# Patient Record
Sex: Female | Born: 1985 | Marital: Married | Smoking: Never smoker
Health system: Southern US, Community
[De-identification: ages and names within clinical notes are randomized; demographics above are authoritative.]

## PROBLEM LIST (undated history)

## (undated) ENCOUNTER — Inpatient Hospital Stay (HOSPITAL_COMMUNITY): Payer: Self-pay

## (undated) DIAGNOSIS — D649 Anemia, unspecified: Secondary | ICD-10-CM

## (undated) DIAGNOSIS — F909 Attention-deficit hyperactivity disorder, unspecified type: Secondary | ICD-10-CM

## (undated) HISTORY — DX: Attention-deficit hyperactivity disorder, unspecified type: F90.9

## (undated) HISTORY — DX: Anemia, unspecified: D64.9

## (undated) HISTORY — PX: NO PAST SURGERIES: SHX2092

---

## 2008-05-01 ENCOUNTER — Ambulatory Visit: Payer: Self-pay | Admitting: Obstetrics & Gynecology

## 2008-05-01 ENCOUNTER — Encounter: Payer: Self-pay | Admitting: Family Medicine

## 2008-05-01 LAB — CONVERTED CEMR LAB
Antibody Screen: NEGATIVE
Eosinophils Relative: 1 % (ref 0–5)
HCT: 31.9 % — ABNORMAL LOW (ref 36.0–46.0)
Hemoglobin: 11.1 g/dL — ABNORMAL LOW (ref 12.0–15.0)
Hepatitis B Surface Ag: NEGATIVE
Lymphocytes Relative: 16 % (ref 12–46)
Lymphs Abs: 1.2 10*3/uL (ref 0.7–4.0)
Monocytes Absolute: 0.5 10*3/uL (ref 0.1–1.0)
RBC: 3.66 M/uL — ABNORMAL LOW (ref 3.87–5.11)
Rh Type: POSITIVE
Rubella: 52.5 intl units/mL — ABNORMAL HIGH
WBC: 7.3 10*3/uL (ref 4.0–10.5)

## 2008-05-15 ENCOUNTER — Other Ambulatory Visit: Admission: RE | Admit: 2008-05-15 | Discharge: 2008-05-15 | Payer: Self-pay | Admitting: Obstetrics & Gynecology

## 2008-05-15 ENCOUNTER — Encounter: Payer: Self-pay | Admitting: Obstetrics & Gynecology

## 2008-05-15 ENCOUNTER — Ambulatory Visit: Payer: Self-pay | Admitting: Obstetrics & Gynecology

## 2008-05-16 ENCOUNTER — Ambulatory Visit (HOSPITAL_COMMUNITY): Admission: RE | Admit: 2008-05-16 | Discharge: 2008-05-16 | Payer: Self-pay | Admitting: Obstetrics & Gynecology

## 2008-06-09 ENCOUNTER — Ambulatory Visit: Payer: Self-pay | Admitting: Family Medicine

## 2008-07-07 ENCOUNTER — Ambulatory Visit: Payer: Self-pay | Admitting: Obstetrics & Gynecology

## 2008-07-28 ENCOUNTER — Ambulatory Visit: Payer: Self-pay | Admitting: Obstetrics & Gynecology

## 2008-07-29 ENCOUNTER — Encounter: Payer: Self-pay | Admitting: Family Medicine

## 2008-07-29 LAB — CONVERTED CEMR LAB
Hemoglobin: 10.8 g/dL — ABNORMAL LOW (ref 12.0–15.0)
MCHC: 33 g/dL (ref 30.0–36.0)
MCV: 94.5 fL (ref 78.0–100.0)
RDW: 12.7 % (ref 11.5–15.5)

## 2008-07-31 ENCOUNTER — Ambulatory Visit (HOSPITAL_COMMUNITY): Admission: RE | Admit: 2008-07-31 | Discharge: 2008-07-31 | Payer: Self-pay | Admitting: Obstetrics & Gynecology

## 2008-08-25 ENCOUNTER — Ambulatory Visit: Payer: Self-pay | Admitting: Obstetrics & Gynecology

## 2008-10-06 ENCOUNTER — Ambulatory Visit: Payer: Self-pay | Admitting: Family Medicine

## 2008-10-07 ENCOUNTER — Encounter: Payer: Self-pay | Admitting: Family Medicine

## 2008-10-13 ENCOUNTER — Ambulatory Visit: Payer: Self-pay | Admitting: Obstetrics and Gynecology

## 2008-10-16 ENCOUNTER — Ambulatory Visit: Payer: Self-pay | Admitting: Obstetrics & Gynecology

## 2008-10-18 ENCOUNTER — Ambulatory Visit: Payer: Self-pay | Admitting: Advanced Practice Midwife

## 2008-10-18 ENCOUNTER — Inpatient Hospital Stay (HOSPITAL_COMMUNITY): Admission: AD | Admit: 2008-10-18 | Discharge: 2008-10-20 | Payer: Self-pay | Admitting: Obstetrics & Gynecology

## 2008-11-06 ENCOUNTER — Ambulatory Visit: Payer: Self-pay | Admitting: Obstetrics and Gynecology

## 2008-11-25 ENCOUNTER — Ambulatory Visit: Payer: Self-pay | Admitting: Obstetrics & Gynecology

## 2008-12-17 ENCOUNTER — Ambulatory Visit: Payer: Self-pay | Admitting: Family Medicine

## 2010-04-22 LAB — CBC
HCT: 36.8 % (ref 36.0–46.0)
MCHC: 34.6 g/dL (ref 30.0–36.0)
MCV: 95.9 fL (ref 78.0–100.0)
RBC: 3.84 MIL/uL — ABNORMAL LOW (ref 3.87–5.11)

## 2010-04-22 LAB — RPR: RPR Ser Ql: NONREACTIVE

## 2010-06-01 NOTE — Assessment & Plan Note (Signed)
Jasmine Ritter, Jasmine Ritter                ACCOUNT NO.:  [id number]   MEDICAL RECORD NO.:  [id number]          PATIENT TYPE:  POB   LOCATION:  [facility] at [facility]         FACILITY:  [facility]   PHYSICIAN:  Scheryl Darter, MD       DATE OF BIRTH:  03/12/1985   DATE OF SERVICE:  11/25/2008                                  CLINIC NOTE   The patient comes to the office today for postpartum exam.  The patient  delivered a baby girl on October 2.  Baby's name is Jasmine Ritter.  Baby is  doing very well.  Mom is doing very well.  She did not have any tears or  any difficulty with her delivery.  Since then, the baby has developed  rash which has given her some issues with her breast-feeding, but after  having her nystatin from the pediatrician, she is doing much better.  Baby is sleeping well mostly through the night.  The patient is doing  very well overall except for some feelings of sadness.  She does have a  longstanding history of anxiety and she is not sure that this is not  related to being new mother.  The patient will think about this for a  while, discuss with her partner the level of sadness, and the effects  that it is having on her life.  We discussed her weighing and balancing  the risk benefit ratios of taking medications such as Prozac while she  is breast-feeding.  The patient was given a Depo-Provera injection in  the hospital.  She has not started her menstrual cycle.  She would like  to continue on with the Depo for contraception.  She has not resumed  sexual activity with her partner.  They are at a place in their  relationship for they are uncertain whether they will resume intercourse  any time soon.   ALLERGIES:  No known drug allergies.   PHYSICAL EXAMINATION:  VITAL SIGNS:  Blood pressure is 126/72, weight is  129, height is 5 feet 3 inches, pulse 74.  GENERAL:  Well-developed, well-nourished, 25 year old Caucasian female  in no acute distress.  BREASTS:  There is a slight amount of  redness around the nipple.  Otherwise negative exam.  GENITALIA:  Externally, there are no lesions, discharges, tears  internally.  There is a very small amount of white discharge.  Cervix is  closed.  BIMANUAL:  No cervical motion tenderness.  Uterus is not palpable at  this time.  Adnexa, no mass appreciated.   ASSESSMENT:  Postpartum exam.   PLAN:  We had a lengthy discussion concerning the patient's anxiety  versus postpartum depression.  The patient will consider whether she  would like to start on a medication like Prozac if so she will call back  the office.  We will start her on the small dose of just 10-20 mg daily.  She will continue with her Depo-Provera 150 mg IM every 12-13  weeks.  The patient is aware that her next Depo needs to be around  Christmas time.  The patient will follow up for her yearly exam as  needed.  Jasmine Richter, NP    ______________________________  Scheryl Darter, MD    LR/MEDQ  D:  11/25/2008  T:  11/26/2008  Job:  [id number]

## 2013-01-17 DIAGNOSIS — Z333 Pregnant state, gestational carrier: Secondary | ICD-10-CM

## 2015-02-24 ENCOUNTER — Encounter (HOSPITAL_COMMUNITY): Payer: Self-pay | Admitting: General Practice

## 2015-02-24 ENCOUNTER — Inpatient Hospital Stay (HOSPITAL_COMMUNITY)
Admission: AD | Admit: 2015-02-24 | Discharge: 2015-02-27 | DRG: 885 | Disposition: A | Discharge status: Home / Self Care | Payer: MEDICAID | Source: Intra-hospital | Attending: Psychiatry | Admitting: Psychiatry

## 2015-02-24 ENCOUNTER — Encounter (HOSPITAL_COMMUNITY): Payer: Self-pay

## 2015-02-24 ENCOUNTER — Emergency Department (HOSPITAL_COMMUNITY)
Admission: EM | Admit: 2015-02-24 | Discharge: 2015-02-24 | Disposition: A | Discharge status: Other Acute Inpatient Hospital | Payer: Medicaid Other | Attending: Emergency Medicine | Admitting: Emergency Medicine

## 2015-02-24 DIAGNOSIS — Z915 Personal history of self-harm: Secondary | ICD-10-CM | POA: Diagnosis not present

## 2015-02-24 DIAGNOSIS — Z3202 Encounter for pregnancy test, result negative: Secondary | ICD-10-CM | POA: Insufficient documentation

## 2015-02-24 DIAGNOSIS — F322 Major depressive disorder, single episode, severe without psychotic features: Principal | ICD-10-CM | POA: Diagnosis present

## 2015-02-24 DIAGNOSIS — R45851 Suicidal ideations: Secondary | ICD-10-CM | POA: Diagnosis present

## 2015-02-24 DIAGNOSIS — R454 Irritability and anger: Secondary | ICD-10-CM | POA: Insufficient documentation

## 2015-02-24 DIAGNOSIS — F329 Major depressive disorder, single episode, unspecified: Secondary | ICD-10-CM | POA: Diagnosis present

## 2015-02-24 DIAGNOSIS — F32A Depression, unspecified: Secondary | ICD-10-CM | POA: Diagnosis present

## 2015-02-24 LAB — COMPREHENSIVE METABOLIC PANEL
ALBUMIN: 4.3 g/dL (ref 3.5–5.0)
ALT: 14 U/L (ref 14–54)
ANION GAP: 7 (ref 5–15)
AST: 13 U/L — ABNORMAL LOW (ref 15–41)
Alkaline Phosphatase: 59 U/L (ref 38–126)
BUN: 11 mg/dL (ref 6–20)
CHLORIDE: 106 mmol/L (ref 101–111)
CO2: 25 mmol/L (ref 22–32)
Calcium: 9.1 mg/dL (ref 8.9–10.3)
Creatinine, Ser: 0.66 mg/dL (ref 0.44–1.00)
GFR calc non Af Amer: >60 mL/min (ref >60)
GLUCOSE: 104 mg/dL — AB (ref 65–99)
POTASSIUM: 3.8 mmol/L (ref 3.5–5.1)
SODIUM: 138 mmol/L (ref 135–145)
Total Bilirubin: 0.9 mg/dL (ref 0.3–1.2)
Total Protein: 7.5 g/dL (ref 6.5–8.1)

## 2015-02-24 LAB — CBC
HEMATOCRIT: 35.5 % — AB (ref 36.0–46.0)
HEMOGLOBIN: 11.8 g/dL — AB (ref 12.0–15.0)
MCH: 29.1 pg (ref 26.0–34.0)
MCHC: 33.2 g/dL (ref 30.0–36.0)
MCV: 87.4 fL (ref 78.0–100.0)
Platelets: 217 10*3/uL (ref 150–400)
RBC: 4.06 MIL/uL (ref 3.87–5.11)
RDW: 12.4 % (ref 11.5–15.5)
WBC: 4.2 10*3/uL (ref 4.0–10.5)

## 2015-02-24 LAB — POC URINE PREG, ED: Preg Test, Ur: NEGATIVE

## 2015-02-24 LAB — RAPID URINE DRUG SCREEN, HOSP PERFORMED
Amphetamines: NOT DETECTED
BARBITURATES: NOT DETECTED
BENZODIAZEPINES: NOT DETECTED
COCAINE: NOT DETECTED
OPIATES: NOT DETECTED
TETRAHYDROCANNABINOL: NOT DETECTED

## 2015-02-24 LAB — ETHANOL: Alcohol, Ethyl (B): <5 mg/dL (ref <5)

## 2015-02-24 LAB — ACETAMINOPHEN LEVEL

## 2015-02-24 LAB — SALICYLATE LEVEL

## 2015-02-24 MED ORDER — ALUM & MAG HYDROXIDE-SIMETH 200-200-20 MG/5ML PO SUSP
30.0000 mL | ORAL | Status: DC | PRN
  Administered 2015-02-26: 30 mL via ORAL
  Filled 2015-02-24: qty 30

## 2015-02-24 MED ORDER — MAGNESIUM HYDROXIDE 400 MG/5ML PO SUSP: 30.0000 mL | Freq: Every day | ORAL | Status: DC | PRN

## 2015-02-24 MED ORDER — FLUOXETINE HCL 10 MG PO CAPS
10.0000 mg | ORAL_CAPSULE | Freq: Every day | ORAL | Status: DC
  Administered 2015-02-24: 10 mg via ORAL
  Filled 2015-02-24: qty 1

## 2015-02-24 MED ORDER — ACETAMINOPHEN 325 MG PO TABS: 650.0000 mg | ORAL_TABLET | Freq: Four times a day (QID) | ORAL | Status: DC | PRN

## 2015-02-24 MED ORDER — FLUOXETINE HCL 10 MG PO CAPS
10.0000 mg | ORAL_CAPSULE | Freq: Every day | ORAL | Status: DC
  Administered 2015-02-25: 10 mg via ORAL
  Filled 2015-02-24 (×3): qty 1

## 2015-02-24 MED ORDER — HYDROXYZINE HCL 25 MG PO TABS
25.0000 mg | ORAL_TABLET | Freq: Every day | ORAL | Status: DC
  Administered 2015-02-24 – 2015-02-25 (×2): 25 mg via ORAL
  Filled 2015-02-24 (×6): qty 1

## 2015-02-24 MED ORDER — HYDROXYZINE HCL 25 MG PO TABS: 25.0000 mg | ORAL_TABLET | Freq: Every day | ORAL | Status: DC

## 2015-02-24 NOTE — ED Notes (Signed)
Patient entered the unit ambulatory.  She is tearful on approach and has poor eye contact.  She admits that she tried to jump off of a building about two weeks ago, but could not go through with it.  She was offered food and drink, but declined.

## 2015-02-24 NOTE — Tx Team (Addendum)
Initial Interdisciplinary Treatment Plan   PATIENT STRESSORS: Financial difficulties Marital or family conflict Occupational concerns   PATIENT STRENGTHS: Average or above average intelligence Capable of independent living Communication skills Supportive family/friends   PROBLEM LIST: Problem List/Patient Goals Date to be addressed Date deferred Reason deferred Estimated date of resolution  Depression 02.07.2017     Suicidal Ideation 02.07.2017     Marital conflict 02.07.2017           "I need help stopping the thoughts of hurting myself"                               DISCHARGE CRITERIA:  Improved stabilization in mood, thinking, and/or behavior Motivation to continue treatment in a less acute level of care Need for constant or close observation no longer present Reduction of life-threatening or endangering symptoms to within safe limits Verbal commitment to aftercare and medication compliance  PRELIMINARY DISCHARGE PLAN: Attend aftercare/continuing care group Outpatient therapy Return to previous living arrangement  PATIENT/FAMIILY INVOLVEMENT: This treatment plan has been presented to and reviewed with the patient, Jasmine Ritter.  The patient and family have been given the opportunity to ask questions and make suggestions.  [place] Mon 02/24/2015, 5:33 PM

## 2015-02-24 NOTE — ED Notes (Signed)
Patient transferred to [facility].  She left the unit ambulatory with [employer].  She has no belongings here as her husband took them home.  She called her husband and left a message that she was moving so that he can bring her clothes there.

## 2015-02-24 NOTE — ED Notes (Signed)
Per spouse, pt is suicidal.  2 weeks ago pt took car. Was on phone with spouse and made statements of dropping off bridge.  Last night, pt making same statements of killing self after dropping dtr at school.  Pt denies SI/HI.  States she has hx of same.  However, does state nobody wants her to live and that her 28 year old dtr doesn't want her anymore.

## 2015-02-24 NOTE — BHH Counselor (Signed)
Patient accepted to [facility] by Dr. Jannifer Franklin and Julieanne Cotton, NP. Patient accepted to Room #302-2. Nursing report [phone]. Patient to be transported via [place].

## 2015-02-24 NOTE — Progress Notes (Signed)
pcp per [facility] access is [facility] [address] [place], [place] [address] [phone]

## 2015-02-24 NOTE — ED Notes (Signed)
Bed: WBH42 Expected date:  Expected time:  Means of arrival:  Comments: Triage 4 
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
## 2015-02-24 NOTE — ED Provider Notes (Signed)
CSN: [id number]     Arrival date & time 02/24/15  0757 History   First MD Initiated Contact with Patient 02/24/15 [phone]     Chief Complaint  Patient presents with  . Suicidal     (Consider location/radiation/quality/duration/timing/severity/associated sxs/prior Treatment) Patient is a 30 y.o. female presenting with mental health disorder. The history is provided by the patient.  Mental Health Problem Presenting symptoms: suicidal thoughts and suicidal threats   Patient accompanied by:  Spouse Degree of incapacity (severity):  Moderate Onset quality:  Gradual Duration:  24 months Timing:  Constant Progression:  Worsening Chronicity:  Recurrent Treatment compliance:  Untreated Relieved by:  Nothing Worsened by:  Nothing tried Ineffective treatments:  None tried Associated symptoms: no chest pain and no headaches   Risk factors: hx of suicide attempts (tried to slit her wrist in the distant past)   Risk factors: no hx of mental illness     30 yo F with a chief complaint of suicidal ideation. Patient states for the past couple years she has felt this way the getting worse over the past couple weeks. Patient has on a couple locations taken the car without signing any one where she was going and has told her husband that she was going to kill herself here recently. Told her that she was going to jump off a bridge. Has a history of trying to slit her wrists in the distant past. Does not see a psychiatrist does not take any medications for any medical problems. Unknown if any past psychiatric diagnoses. Patient feels like this is getting worse because her child is becoming very upset at school and when they tried make them do schoolwork of a lash out at her.  History reviewed. No pertinent past medical history. History reviewed. No pertinent past surgical history. History reviewed. No pertinent family history. Social History  Substance Use Topics  . Smoking status: Never Smoker   .  Smokeless tobacco: None  . Alcohol Use: Yes     Comment: social   OB History    No data available     Review of Systems  Constitutional: Negative for fever and chills.  HENT: Negative for congestion and rhinorrhea.   Eyes: Negative for redness and visual disturbance.  Respiratory: Negative for shortness of breath and wheezing.   Cardiovascular: Negative for chest pain and palpitations.  Gastrointestinal: Negative for nausea and vomiting.  Genitourinary: Negative for dysuria and urgency.  Musculoskeletal: Negative for myalgias and arthralgias.  Skin: Negative for pallor and wound.  Neurological: Negative for dizziness and headaches.  Psychiatric/Behavioral: Positive for suicidal ideas.      Allergies  Review of patient's allergies indicates no known allergies.  Home Medications   Prior to Admission medications   Not on File   BP 113/71 mmHg  Pulse 73  Temp(Src) 98.6 F (37 C) (Oral)  Resp 18  SpO2 100%  LMP 02/19/2015 Physical Exam  Constitutional: She is oriented to person, place, and time. She appears well-developed and well-nourished. No distress.  HENT:  Head: Normocephalic and atraumatic.  Eyes: EOM are normal. Pupils are equal, round, and reactive to light.  Neck: Normal range of motion. Neck supple.  Cardiovascular: Normal rate and regular rhythm.  Exam reveals no gallop and no friction rub.   No murmur heard. Pulmonary/Chest: Effort normal. She has no wheezes. She has no rales.  Abdominal: Soft. She exhibits no distension. There is no tenderness. There is no rebound.  Musculoskeletal: She exhibits no edema or tenderness.  Neurological: She is alert and oriented to person, place, and time.  Skin: Skin is warm and dry. She is not diaphoretic.  Psychiatric: Her behavior is normal. Her affect is angry. She expresses suicidal ideation. She expresses no homicidal ideation. She expresses no suicidal plans and no homicidal plans.  Nursing note and vitals  reviewed.   ED Course  Procedures (including critical care time) Labs Review Labs Reviewed  COMPREHENSIVE METABOLIC PANEL - Abnormal; Notable for the following:    Glucose, Bld 104 (*)    AST 13 (*)    All other components within normal limits  ACETAMINOPHEN LEVEL - Abnormal; Notable for the following:    Acetaminophen (Tylenol), Serum <10 (*)    All other components within normal limits  CBC - Abnormal; Notable for the following:    Hemoglobin 11.8 (*)    HCT 35.5 (*)    All other components within normal limits  ETHANOL  SALICYLATE LEVEL  URINE RAPID DRUG SCREEN, HOSP PERFORMED  POC URINE PREG, ED    Imaging Review No results found. I have personally reviewed and evaluated these images and lab results as part of my medical decision-making.   EKG Interpretation None      MDM   Final diagnoses:  Suicidal ideation    29yo F with a chief complaint of suicidal ideation. This is been an ongoing issue for this patient. Depressed on exam. TTS evaluation.  Psych will admit.   The patients results and plan were reviewed and discussed.   Any x-rays performed were independently reviewed by myself.   Differential diagnosis were considered with the presenting HPI.  Medications  FLUoxetine (PROZAC) capsule 10 mg (10 mg Oral Given 02/24/15 1126)  hydrOXYzine (ATARAX/VISTARIL) tablet 25 mg (not administered)    Filed Vitals:   02/24/15 0815 02/24/15 1258  BP: 133/90 113/71  Pulse: 74 73  Temp: 98 F (36.7 C) 98.6 F (37 C)  TempSrc: Oral Oral  Resp: 18 18  SpO2: 100% 100%    Final diagnoses:  Suicidal ideation    Admission/ observation were discussed with the admitting physician, patient and/or family and they are comfortable with the plan.    Melene Plan, DO 02/24/15 1438

## 2015-02-25 ENCOUNTER — Encounter (HOSPITAL_COMMUNITY): Payer: Self-pay | Admitting: Psychiatry

## 2015-02-25 MED ORDER — BUPROPION HCL ER (XL) 150 MG PO TB24
150.0000 mg | ORAL_TABLET | Freq: Every day | ORAL | Status: DC
Start: 2015-02-26 — End: 2015-02-27
  Administered 2015-02-26 – 2015-02-27 (×2): 150 mg via ORAL
  Filled 2015-02-25 (×4): qty 1

## 2015-02-25 MED ORDER — BUPROPION HCL ER (XL) 150 MG PO TB24: 150.0000 mg | ORAL_TABLET | Freq: Every day | ORAL | Status: DC

## 2015-02-25 NOTE — Progress Notes (Signed)
Adult Psychoeducational Group Note  Date:  02/25/2015 Time:  8:26 PM  Group Topic/Focus:  Wrap-Up Group:   The focus of this group is to help patients review their daily goal of treatment and discuss progress on daily workbooks.  Participation Level:  Active  Participation Quality:  Appropriate  Affect:  Appropriate  Cognitive:  Appropriate  Insight: Appropriate  Engagement in Group:  Engaged  Modes of Intervention:  Discussion  Additional Comments:  Pt was pleasant during wrap-up group. Pt rated her overall day a 6 out of 10 and stated that her day was "okay". Pt reported that she did not have a goal for the day, however the highlight of her day was seeing her husband.   Cleotilde Neer 02/25/2015, 8:52 PM
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
## 2015-02-25 NOTE — Plan of Care (Signed)
Problem: Diagnosis: Increased Risk For Suicide Attempt Goal: STG-Patient Will Comply With Medication Regime Outcome: Progressing Pt was compliant with scheduled medication tonight.      

## 2015-02-25 NOTE — [facility] Group Notes (Signed)
[facility] LCSW Group Therapy 02/25/2015 1:15 PM  Type of Therapy: Group Therapy- Emotion Regulation  Participation Level: Active   Participation Quality:  Appropriate  Affect: Appropriate  Cognitive: Alert and Oriented   Insight:  Developing/Improving  Engagement in Therapy: Developing/Improving and Engaged   Modes of Intervention: Clarification, Confrontation, Discussion, Education, Exploration, Limit-setting, Orientation, Problem-solving, Rapport Building, Dance movement psychotherapist, Socialization and Support  Summary of Progress/Problems: The topic for group today was emotional regulation. This group focused on both positive and negative emotion identification and allowed group members to process ways to identify feelings, regulate negative emotions, and find healthy ways to manage internal/external emotions. Group members were asked to reflect on a time when their reaction to an emotion led to a negative outcome and explored how alternative responses using emotion regulation would have benefited them. Group members were also asked to discuss a time when emotion regulation was utilized when a negative emotion was experienced. Pt expresses that she finds anger difficult to regulate, especially regarding the behavior of her 6yo daughter. Pt identified financial stress as a trigger for heightened anxiety and frustration. She participated appropriately in group discussion and activity.   Chad Cordial, LCSWA 02/25/2015 4:29 PM

## 2015-02-25 NOTE — B[facility] Suicide Risk Assessment (Signed)
[facility] INPATIENT:  Family/Significant Other Suicide Prevention Education  Suicide Prevention Education:  Education Completed; Hewitt Shorts, Pt's husband [phone],  has been identified by the patient as the family member/significant other with whom the patient will be residing, and identified as the person(s) who will aid the patient in the event of a mental health crisis (suicidal ideations/suicide attempt).  With written consent from the patient, the family member/significant other has been provided the following suicide prevention education, prior to the and/or following the discharge of the patient.  The suicide prevention education provided includes the following:  Suicide risk factors  Suicide prevention and interventions  National Suicide Hotline telephone number  [facility] assessment telephone number  [facility] Emergency Assistance 911  [facility] and/or Residential Mobile Crisis Unit telephone number  Request made of family/significant other to:  Remove weapons (e.g., guns, rifles, knives), all items previously/currently identified as safety concern.    Remove drugs/medications (over-the-counter, prescriptions, illicit drugs), all items previously/currently identified as a safety concern.  The family member/significant other verbalizes understanding of the suicide prevention education information provided.  The family member/significant other agrees to remove the items of safety concern listed above.  Elaina Hoops 02/25/2015, 4:28 PM

## 2015-02-25 NOTE — B[facility] Suicide Risk Assessment (Signed)
[facility] Admission Suicide Risk Assessment   Nursing information obtained from:    Demographic factors:    Current Mental Status:    Loss Factors:    Historical Factors:    Risk Reduction Factors:     Total Time spent with patient: 45 minutes Principal Problem: Severe major depression without psychotic features (HCC) Diagnosis:   Patient Active Problem List   Diagnosis Date Noted  . Severe major depression without psychotic features (HCC) [F32.2] 02/24/2015   Subjective Data: see admission H and P  Continued Clinical Symptoms:  Alcohol Use Disorder Identification Test Final Score (AUDIT): 1 The "Alcohol Use Disorders Identification Test", Guidelines for Use in Primary Care, Second Edition.  World Science writer [facility]). Score between 0-7:  no or low risk or alcohol related problems. Score between 8-15:  moderate risk of alcohol related problems. Score between 16-19:  high risk of alcohol related problems. Score 20 or above:  warrants further diagnostic evaluation for alcohol dependence and treatment.   CLINICAL FACTORS:   Depression:   Severe  Psychiatric Specialty Exam: ROS  Blood pressure 133/71, pulse 77, temperature 97.4 F (36.3 C), temperature source Oral, resp. rate 17, height  (1.626 m), weight 68.04 kg (150 lb), last menstrual period 02/19/2015, SpO2 100 %.Body mass index is 25.73 kg/(m^2).   COGNITIVE FEATURES THAT CONTRIBUTE TO RISK:  Closed-mindedness, Polarized thinking and Thought constriction (tunnel vision)    SUICIDE RISK:   Moderate:  Frequent suicidal ideation with limited intensity, and duration, some specificity in terms of plans, no associated intent, good self-control, limited dysphoria/symptomatology, some risk factors present, and identifiable protective factors, including available and accessible social support.  PLAN OF CARE: See admission H and P  I certify that inpatient services furnished can reasonably be expected to improve the patient's  condition.   Rachael Fee, MD 02/25/2015, 3:23 PM

## 2015-02-25 NOTE — B[facility] Group Notes (Signed)
[facility] LCSW Aftercare Discharge Planning Group Note   02/25/2015 9:34 AM  Participation Quality:  Appropriate   Mood/Affect:  Appropriate  Depression Rating:  "low"   Anxiety Rating:  "none"   Thoughts of Suicide:  No Will you contract for safety?   NA  Current AVH:  No  Plan for Discharge/Comments:  Pt reports that she does not want to be at the hospital and her husband thinks she is at risk for suicide. Pt reports issues with marriage and with 6yo daughter saying hurtful things to her. Pt denies any prior mental health treatment inpatient or outpatient. She denies Substance abuse.   Transportation Means: husband   Supports: husband/limited family supports   Counselling psychologist, Conservation officer, nature
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
## 2015-02-25 NOTE — Progress Notes (Signed)
Recreation Therapy Notes  Date: 02.08.2017  Time: 9:30am Location: [address] Group Room   Group Topic: Stress Management  Goal Area(s) Addresses:  Patient will actively participate in stress management techniques presented during session.   Behavioral Response: Did not attend.   Duff Pozzi L Azhar Knope, LRT/CTRS        Tyran Huser L 02/25/2015 2:40 PM 
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
## 2015-02-25 NOTE — Progress Notes (Signed)
Patient ID: Jasmine Ritter, female   DOB: [birth year], 30 y.o.   MRN: [id number]  DAR: Pt. Denies SI/HI and A/V Hallucinations however is able to contract for safety if she does have thoughts of SI. She reports sleep was fair, appetite is fair, energy level is low, and concentration is good. She rates depression 5-7/10, hopelessness 4/10, and anxiety 3/10. Patient does not report any pain or discomfort at this time. Support and encouragement provided to the patient. Scheduled medications administered to patient per physician's orders. Patient tolerated well and does not report any adverse effects to writer at this time. Patient is cooperative but remains minimal with Clinical research associate. She is seen in the milieu at times but stays to herself. Q15 minute checks are maintained for safety.

## 2015-02-25 NOTE — Progress Notes (Signed)
D: Pt has anxious affect and mood.  She reports "I don't wanna be here, but I know it's important."  Pt reports she had a good visit with her husband tonight.  Pt reports passive SI without a plan and she verbally contracts for safety.  She denies HI, denies hallucinations, denies pain.  Pt has been visible in milieu interacting with peers and staff appropriately. A: Introduced self to pt.  Met with pt 1:1 and offered support and encouragement.  Actively listened to pt.  Medication administered per order.   R: Pt is compliant with medication.  Pt verbally contracts for safety.  Will continue to monitor and assess.

## 2015-02-26 DIAGNOSIS — F322 Major depressive disorder, single episode, severe without psychotic features: Principal | ICD-10-CM

## 2015-02-26 MED ORDER — BUSPIRONE HCL 5 MG PO TABS
5.0000 mg | ORAL_TABLET | Freq: Three times a day (TID) | ORAL | Status: DC
  Administered 2015-02-26 – 2015-02-27 (×5): 5 mg via ORAL
  Filled 2015-02-26 (×10): qty 1

## 2015-02-26 NOTE — Progress Notes (Signed)
D: Patient continues to be superficial with her feelings.  She states she is just "ok."  She divulges little information about herself and her situation.  Her depression is "not really any."  Her goal today is to work on "anxiety.  Feel something so I can go home."  She rates her depression as a 1/2; hopelessness is a 0; anxiety is a 4-6.  She denies any physical pain.  She denies SI/HI/AVH.  She is attending groups and participating. A: Continue to monitor medication management and MD orders.  Safety checks completed every 15 minutes per protocol.  Offer support and encouragement as needed. R: Patient is receptive to staff; her behavior is appropriate.

## 2015-02-26 NOTE — [facility] Group Notes (Signed)
[facility] Group Notes:  (Nursing/MHT/Case Management/Adjunct)  Date:  02/26/2015  Time: 0830 am  Type of Therapy:  Psychoeducational Skills  Participation Level:  Did Not Attend Patient invited; declined to attend.  [place]ranford Mon 02/26/2015, 10:53 AM

## 2015-02-26 NOTE — Progress Notes (Signed)
Adult Psychoeducational Group Note  Date:  02/26/2015 Time:  8:57 PM  Group Topic/Focus:  Wrap-Up Group:   The focus of this group is to help patients review their daily goal of treatment and discuss progress on daily workbooks.  Participation Level:  Active  Participation Quality:  Appropriate  Affect:  Appropriate  Cognitive:  Alert  Insight: Appropriate  Engagement in Group:  Engaged  Modes of Intervention:  Problem-solving  Additional Comments:  Jasmine Ritter shared with the group that today was not so bad but it was better than the day before.  However she is expecting for it to a better day on tomorrow.   Jasmine Ritter 02/26/2015, 8:57 PM

## 2015-02-26 NOTE — Plan of Care (Signed)
Problem: Diagnosis: Increased Risk For Suicide Attempt Goal: LTG-Patient Will Report Improved Mood and Deny Suicidal LTG (by discharge) Patient will report improved mood and deny suicidal ideation.  Outcome: Progressing Patient denies SI.  She minimally expresses her feelings to staff.

## 2015-02-26 NOTE — Progress Notes (Signed)
Pt reports that her day has been ok, but that she would really like to go home.  Her husband came to visit her tonight.  She says she misses her daughter.  She denies SI/HI/AVH.  She was started on Wellbutrin today.  Discussed with her that maybe she should stay a couple of days to see how the medication affects her.  Pt still would like to go home tomorrow.  Pt has been observed in the dayroom, but with minimal interaction with peers.  Pt was encouraged to make her needs known to staff.  Support and encouragement offered.  Safety maintained with q15 minute checks.

## 2015-02-26 NOTE — [facility] Group Notes (Signed)
[facility] Mental [facility] [employer] Group Therapy 02/26/2015 1:15pm  Type of Therapy: Mental Health Association Presentation  Participation Level: Active  Participation Quality: Attentive  Affect: Appropriate  Cognitive: Oriented  Insight: Developing/Improving  Engagement in Therapy: Engaged  Modes of Intervention: Discussion, Education and Socialization  Summary of Progress/Problems: Mental Health Association (MHA) Speaker came to talk about his personal journey with substance abuse and addiction. The pt processed ways by which to relate to the speaker. MHA speaker provided handouts and educational information pertaining to groups and services offered by the [facility]. Pt was engaged in speaker's presentation and was receptive to resources provided.    Chad Cordial, LCSWA 02/26/2015 1:49 PM
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
## 2015-02-27 MED ORDER — BUSPIRONE HCL 5 MG PO TABS: 5.0000 mg | ORAL_TABLET | Freq: Three times a day (TID) | ORAL | Status: DC

## 2015-02-27 MED ORDER — HYDROXYZINE HCL 25 MG PO TABS: 25.0000 mg | ORAL_TABLET | Freq: Every day | ORAL | Status: DC

## 2015-02-27 MED ORDER — BUPROPION HCL ER (XL) 150 MG PO TB24: 150.0000 mg | ORAL_TABLET | Freq: Every day | ORAL | Status: DC

## 2015-02-27 NOTE — Progress Notes (Signed)
[facility] Discharge note:  D:  Pt. verbalizes readiness for discharge and denies SI/HI.   A: Discharge instructions reviewed with patient and family, belongings returned, prescriptions given as applicable.    R: Pt. And family verbalize understanding of d/c instructions and state their intent to be compliant with them.  Pt discharged to caregiver without incident.  Sultana Tierney, RN  

## 2015-02-27 NOTE — Discharge Summary (Signed)
Physician Discharge Summary Note  Patient:  Jasmine Ritter is an 30 y.o., female MRN:  [id number] DOB:  Jun 12, 1985  Patient phone:  [phone] (home)   Patient address:   [address] [place] [address],   Total Time spent with patient: Greater than 30 minutes  Date of Admission:  02/24/2015  Date of Discharge: 02-27-15  Reason for Admission: Worsening symptoms of depression triggering suicidal ideations  Principal Problem: Severe major depression without psychotic features [facility])  Discharge Diagnoses: Patient Active Problem List   Diagnosis Date Noted  . Severe major depression without psychotic features [facility]) [F32.2] 02/24/2015   Past Psychiatric History: Major depressive disorder, Self-mutilation  Past Medical History: History reviewed. No pertinent past medical history. History reviewed. No pertinent past surgical history.  Family History: History reviewed. No pertinent family history.  Family Psychiatric  History: See H&P  Social History:  History  Alcohol Use  . Yes    Comment: social     History  Drug Use No    Social History   Social History  . Marital Status: Single    Spouse Name: N/A  . Number of Children: N/A  . Years of Education: N/A   Social History Main Topics  . Smoking status: Never Smoker   . Smokeless tobacco: None  . Alcohol Use: Yes     Comment: social  . Drug Use: No  . Sexual Activity: Not Asked   Other Topics Concern  . None   Social History Narrative   Hospital Course: 30 Y/O female who states her husband brought her as he thinks she wants to kill herself. Two weeks ago she went to the parking deck downtown [place] to kill herself. States she was there by herself and could not get to do it. States things started to pile up again and daughter was telling her she did not want to have a mother. States she feels her husband is not supportive. States last few years she feels he does not care. 7-8 years ago tried to  kill herself by cutting. At that time she was dealing with family of origin issues. She describes traumatic experiences when growing up and for a time there she had symptoms of PTSD but not anymore. She does not feel she had postpartum depression as they were busy active after their daughter was born. She does say there is a seasonal component to the depression. Her husband is in the mental health field but now is working at a [employer]. She states he himself seems to be dealing with his own stuff. She was diagnosed with ADHD early on and had been on stimulants but not any more.  Safire was admitted to the hospital for worsening symptoms of depression/crisis management. She presented with chronic depression & hx of suicide attempt by cutting. After her admission assessment, her presenting symptoms were identified. The medication regimen targeting those symptoms were initiated with her consent. She was medicated & discharged on; Wellbutrin XL 150 mg for depression, Buspar 5 mg for anxiety & Hydroxyzine 25 mg for anxiety. She presented no other significant  pre-existing medical problems that required treatment. She tolerated her treatment regimen without any adverse effects reported. Mikeyla was enrolled & participated in the group counseling sessions being offered & held on this unit. She learned coping skills..  During the course of her hospitalization, Mahima's mprovement was monitored by observation & her daily report of symptom reduction. Her emotional and mental status were monitored by daily  self-inventory reports completed by her & the clinical staff. She was evaluated by the treatment team for stability and plans for continued recovery after discharge. Makyla's motivation was an integral factor in her mood stability. She was offered further treatment options upon discharge as noted. Upon discharge, Alezandra was both mentally & medically stable. She denies suicidal/homicidal ideations, auditory/visual/tactile  hallucinations, delusional thoughts or paranoia. She [facility] with [facility] belongings in no distress. Transportation per husband.   Physical Findings: AIMS: Facial and Oral Movements Muscles of Facial Expression: None, normal Lips and Perioral Area: None, normal Jaw: None, normal Tongue: None, normal,Extremity Movements Upper (arms, wrists, hands, fingers): None, normal Lower (legs, knees, ankles, toes): None, normal, Trunk Movements Neck, shoulders, hips: None, normal, Overall Severity Severity of abnormal movements (highest score from questions above): None, normal Incapacitation due to abnormal movements: None, normal Patient's awareness of abnormal movements (rate only patient's report): No Awareness, Dental Status Current problems with teeth and/or dentures?: No Does patient usually wear dentures?: No  CIWA:    COWS:     Musculoskeletal: Strength & Muscle Tone: within normal limits Gait & Station: normal Patient leans: N/A  Psychiatric Specialty Exam: Review of Systems  Constitutional: Negative.   HENT: Negative.   Eyes: Negative.   Respiratory: Negative.   Cardiovascular: Negative.   Gastrointestinal: Negative.   Genitourinary: Negative.   Musculoskeletal: Negative.   Skin: Negative.   Neurological: Negative.   Endo/Heme/Allergies: Negative.   Psychiatric/Behavioral: Positive for depression (Stable). Negative for suicidal ideas, hallucinations, memory loss and substance abuse. The patient has insomnia (Stable). The patient is not nervous/anxious.     Blood pressure 107/70, pulse 91, temperature 97.9 F (36.6 C), temperature source Oral, resp. rate 16, height  (1.626 m), weight 68.04 kg (150 lb), last menstrual period 02/19/2015, SpO2 100 %.Body mass index is 25.73 kg/(m^2).  See Md's SRA   Have you used any form of tobacco in the last 30 days? (Cigarettes, Smokeless Tobacco, Cigars, and/or Pipes): No  Has this patient used any form of tobacco in the last 30 days?  (Cigarettes, Smokeless Tobacco, Cigars, and/or Pipes):  No  Metabolic Disorder Labs:  No results found for: HGBA1C, MPG No results found for: PROLACTIN No results found for: CHOL, TRIG, HDL, CHOLHDL, VLDL, LDLCALC  See Psychiatric Specialty Exam and Suicide Risk Assessment completed by Attending Physician prior to discharge.  Discharge destination:  Home  Is patient on multiple antipsychotic therapies at discharge:  No   Has Patient had three or more failed trials of antipsychotic monotherapy by history:  No  Recommended Plan for Multiple Antipsychotic Therapies: NA    Medication List    TAKE these medications      Indication   buPROPion 150 MG 24 hr tablet  Commonly known as:  WELLBUTRIN XL  Take 1 tablet (150 mg total) by mouth daily. For depression   Indication:  Major Depressive Disorder     busPIRone 5 MG tablet  Commonly known as:  BUSPAR  Take 1 tablet (5 mg total) by mouth 3 (three) times daily. For anxiety   Indication:  Generalized Anxiety Disorder     hydrOXYzine 25 MG tablet  Commonly known as:  ATARAX/VISTARIL  Take 1 tablet (25 mg total) by mouth at bedtime. For anxiety/insomnia   Indication:  Anxiety, insomnia       Follow-up Information    Follow up with [facility] On 03/10/2015.   Why:  at 5:00pm for your initial new patient assessment. Please  arrive 30 minutes early to complete paperwork. Also, bring ID, insurance information, and list of medications.   Contact information:   [address] [place], [place] [address] Phone: [phone] Fax: [phone]      Follow up with [facility] On 03/16/2015.   Why:  at 2:20pm for your psych evaluation and medication management.   Contact information:   [address] [place], [place] [address] Phone: [phone] Fax: [phone]     Follow-up recommendations: Activity:  As tolerated Diet: As recommended by your primary care doctor. Keep all scheduled follow-up appointments as  recommended.   Comments: Take all your medications as prescribed by your mental healthcare provider. Report any adverse effects and or reactions from your medicines to your outpatient provider promptly. Patient is instructed and cautioned to not engage in alcohol and or illegal drug use while on prescription medicines. In the event of worsening symptoms, patient is instructed to call the crisis hotline, 911 and or go to the nearest ED for appropriate evaluation and treatment of symptoms. Follow-up with your primary care provider for your other medical issues, concerns and or health care needs.   Signed: Sanjuana Kava, NP, PMHNP, FNP-BC 02/27/2015, 11:08 AM  Patient seen, Suicide Assessment Completed.  Disposition Plan Reviewed
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
## 2015-02-27 NOTE — Progress Notes (Signed)
  [facility] Adult Case Management Discharge Plan :  Will you be returning to the same living situation after discharge:  Yes,  Pt returning home At discharge, do you have transportation home?: Yes,  Pt husband to pick up Do you have the ability to pay for your medications: Yes,  Pt provided with prescriptions  Release of information consent forms completed and in the chart;  Patient's signature needed at discharge.  Patient to Follow up at: Follow-up Information    Follow up with [facility] On 03/10/2015.   Why:  at 5:00pm for your initial new patient assessment. Please arrive 30 minutes early to complete paperwork. Also, bring ID, insurance information, and list of medications.   Contact information:   [address] [place], [place] [address] Phone: [phone] Fax: [phone]      Follow up with [facility] On 03/16/2015.   Why:  at 2:20pm for your psych evaluation and medication management.   Contact information:   [address] [place], [place] [address] Phone: [phone] Fax: [phone]      Next level of care provider has access to [facility] Link:no  Safety Planning and Suicide Prevention discussed: Yes,  with husband; see SPE note for further details  Have you used any form of tobacco in the last 30 days? (Cigarettes, Smokeless Tobacco, Cigars, and/or Pipes): No  Has patient been referred to the Quitline?: N/A patient is not a smoker  Patient has been referred for addiction treatment: N/A  Elaina Hoops 02/27/2015, 11:41 AM

## 2015-02-27 NOTE — Progress Notes (Signed)
Pt has been observed spending a great deal of time on the phone this evening.  She reports her day has been good.  She says she was started on a new med today.  She is hopeful that the combo of medication with help her be able to function at home and deal with her daughter.  She denies SI/HI/AVH.  She makes her needs known to staff.  Support and encouragement offered.  Discharge plans are in process.  Safety maintained with q15 minute checks.

## 2015-02-27 NOTE — BHH Group Notes (Signed)
[facility] LCSW Aftercare Discharge Planning Group Note  02/27/2015 8:45 AM  Participation Quality: Alert, Appropriate and Oriented  Mood/Affect: Appropriate  Depression Rating: 1  Anxiety Rating: 1  Thoughts of Suicide: Pt denies SI/HI  Will you contract for safety? Yes  Current AVH: Pt denies  Plan for Discharge/Comments: Pt attended discharge planning group and actively participated in group. CSW discussed suicide prevention education with the group and encouraged them to discuss discharge planning and any relevant barriers. Pt reports that her anxiety is related to a desire to return home. She expresses feeling better today.  Transportation Means: Pt reports access to transportation  Supports: No supports mentioned at this time  Chad Cordial, LCSWA 02/27/2015 9:32 AM

## 2015-02-27 NOTE — BHH Suicide Risk Assessment (Signed)
[facility] Discharge Suicide Risk Assessment   Principal Problem: Severe major depression without psychotic features [facility]) Discharge Diagnoses:  Patient Active Problem List   Diagnosis Date Noted  . Severe major depression without psychotic features (HCC) [F32.2] 02/24/2015    Total Time spent with patient: 30 minutes  Musculoskeletal: Strength & Muscle Tone: within normal limits Gait & Station: normal Patient leans: N/A  Psychiatric Specialty Exam: ROS  Blood pressure 107/70, pulse 91, temperature 97.9 F (36.6 C), temperature source Oral, resp. rate 16, height  (1.626 m), weight 150 lb (68.04 kg), last menstrual period 02/19/2015, SpO2 100 %.Body mass index is 25.73 kg/(m^2).  General Appearance: Well Groomed  Eye Contact::  Good  Speech:  Normal Rate409  Volume:  Normal  Mood:  improved mood, at this time feeling better, not depressed   Affect:  Appropriate and Full Range  Thought Process:  Linear  Orientation:  Full (Time, Place, and Person)  Thought Content:  denies hallucinations, no delusions,  not internally preoccupied   Suicidal Thoughts:  No- denies any suicidal ideations, no self injurious ideations, no homicidal ideations,   Homicidal Thoughts:  No  Memory:  recent and remote grossly intact   Judgement:  Improved   Insight:   Improving   Psychomotor Activity:  Normal  Concentration:  Good  Recall:  Good  Fund of Knowledge:Good  Language: Good  Akathisia:  Negative  Handed:  Right  AIMS (if indicated):     Assets:  Communication Skills Desire for Improvement Resilience  Sleep:  Number of Hours: 6  Cognition: WNL  ADL's:  Intact   Mental Status Per Nursing Assessment::   On Admission:     Demographic Factors:  30 year old married female, one daughter   Loss Factors: Family stressors   Historical Factors: History of depression  Risk Reduction Factors:   Responsible for children under 23 years of age, Sense of responsibility to family, Living with  another person, especially a relative and Positive social support  Continued Clinical Symptoms:  Today patient reports feeling better and denying any significant depression. Reports improved mood and presents with fuller range of affect, no thought disorder, no suicidal ideations or any self injurious ideations, no homicidal ideations, no hallucinations, no delusions . Future oriented. States she has had good/ positive conversations with her husband and that she feels more supported, and reports she is looking forward to reunite with her daughter, who has been asking her to come back home because she is missed. She states she no longer feels as if she is not needed or appreciated, and is wanting to return home. Denies medication side effects.  Cognitive Features That Contribute To Risk:  No gross cognitive deficits noted upon discharge. Is alert , attentive, and oriented x 3    Suicide Risk:  Mild:  Suicidal ideation of limited frequency, intensity, duration, and specificity.  There are no identifiable plans, no associated intent, mild dysphoria and related symptoms, good self-control (both objective and subjective assessment), few other risk factors, and identifiable protective factors, including available and accessible social support.  Follow-up Information    Follow up with [facility] On 03/10/2015.   Why:  at 5:00pm for your initial new patient assessment. Please arrive 30 minutes early to complete paperwork. Also, bring ID, insurance information, and list of medications.   Contact information:   [address] [place], [place] [address] Phone: [phone] Fax: [phone]      Follow up with [facility] On 03/16/2015.   Why:  at 2:20pm for your psych evaluation and medication management.   Contact information:   [address] [place], [place] [address] Phone: [phone] Fax: [phone]      Plan Of Care/Follow-up recommendations:  Activity:  as  tolerated  Diet:  Regular Tests:  NA Other:  see below  Patient is requesting discharge and there are no current grounds for involuntary commitment  She is leaving unit in good spirits  Plans to return home - states husband will pick her up later today Plans to follow up as above, and states her  And husband are probably going to go for couples therapy as well .  Nehemiah Massed, MD 02/27/2015, 2:29 PM

## 2017-01-17 NOTE — L&D Delivery Note (Addendum)
Patient: JADEE GOLEBIEWSKI MRN: [id number]  GBS status: negative, IAP given: no  Patient is a 32 y.o. now [id number] s/p NSVD at [redacted]w[redacted]d, who was admitted for IOL for postdates. AROM 1h 30m prior to delivery with clear fluid.    Delivery Note At 7:42 PM a viable female was delivered via SVD (Presentation: cephalic; LOA).  Shoulder dystocia ~1 minute with McRoberts and suprapubic pressure. APGAR: 7, 9; weight 8 lb 14.2 oz (4031 g).   Placenta status: intact, 3-vessel cord to L&D.  Cord:  with the following complications: none.  Cord pH: none  Anesthesia:   Episiotomy:   Lacerations: 2nd degree;Perineal Suture Repair: 3.0 Est. Blood Loss (mL): ~400   Mom to postpartum.  Baby to Couplet care / Skin to Skin.  Gwenevere Abbot 11/04/2017, 8:49 PM     Head delivered LOA. No nuchal cord present. After delivery of the head, anterior shoulder was lodged under pelvic bone.  Called for McRoberts, which was performed by 2 nurses and suprapubic pressure with rocking to maternal right.  After approximately 45 seconds, Dr. Jolayne Panther was called into the room for assistance.  The anterior shoulder was then delivered and the body followed. Infant without spontaneous cry, cord clamped x 2 immediately by delivery provider and baby taken to warmer for assessment. Cord blood drawn. Placenta delivered spontaneously with gentle cord traction. Fundus firm with massage and Pitocin. Perineum inspected and found to have 2nd laceration, which was repaired with 3.0 vicryl with good hemostasis achieved.   Please schedule this patient for Postpartum visit in: 4 weeks with the following provider: Any provider  For C/S patients schedule nurse incision check in weeks 2 weeks: no  Low risk pregnancy complicated by: none  Delivery mode: SVD  Anticipated Birth Control: Nexplanon  PP Procedures needed: none  Schedule Integrated BH visit: no

## 2017-03-21 ENCOUNTER — Ambulatory Visit (INDEPENDENT_AMBULATORY_CARE_PROVIDER_SITE_OTHER): Payer: Medicaid Other | Admitting: General Practice

## 2017-03-21 ENCOUNTER — Ambulatory Visit: Payer: Self-pay

## 2017-03-21 ENCOUNTER — Encounter: Payer: Self-pay | Admitting: General Practice

## 2017-03-21 DIAGNOSIS — Z3201 Encounter for pregnancy test, result positive: Secondary | ICD-10-CM

## 2017-03-21 DIAGNOSIS — Z3491 Encounter for supervision of normal pregnancy, unspecified, first trimester: Secondary | ICD-10-CM

## 2017-03-21 LAB — POCT PREGNANCY, URINE: Preg Test, Ur: POSITIVE — AB

## 2017-03-21 NOTE — Progress Notes (Signed)
Patient here for upt today. UPT +. Patient reports first positive home test a week ago. LMP approximately 01/24/17. Patient reports light period 03/04/17 which was off and on spotting over 6 days. EDD 10/31/17 8 weeks. Patient denies taking any meds. Recommended she begin PNV. Scheduled patient for ultrasound 3/12 due to irregular bleeding. Patient verbalized understanding to appt & had no questions

## 2017-03-22 NOTE — Progress Notes (Signed)
I have reviewed this chart and agree with the RN/CMA assessment and management.    K. Meryl Betti Goodenow, M.D. Attending Obstetrician & Gynecologist, [facility], [facility]  

## 2017-03-28 ENCOUNTER — Ambulatory Visit: Payer: Medicaid Other | Admitting: General Practice

## 2017-03-28 ENCOUNTER — Ambulatory Visit (HOSPITAL_COMMUNITY)
Admission: RE | Admit: 2017-03-28 | Discharge: 2017-03-28 | Disposition: A | Discharge status: Home / Self Care | Payer: Medicaid Other | Source: Ambulatory Visit | Attending: Obstetrics and Gynecology | Admitting: Obstetrics and Gynecology

## 2017-03-28 DIAGNOSIS — Z3491 Encounter for supervision of normal pregnancy, unspecified, first trimester: Secondary | ICD-10-CM | POA: Diagnosis present

## 2017-03-28 DIAGNOSIS — Z3A08 8 weeks gestation of pregnancy: Secondary | ICD-10-CM | POA: Diagnosis not present

## 2017-03-28 DIAGNOSIS — Z712 Person consulting for explanation of examination or test findings: Secondary | ICD-10-CM

## 2017-03-28 NOTE — Progress Notes (Signed)
Chart reviewed for nurse visit. Agree with plan of care.   Pincus Largehelps, Mallori Araque Y, DO 03/28/2017 3:12 PM

## 2017-03-28 NOTE — Progress Notes (Signed)
Patient here for viability results today. Reviewed ultrasound with Dr Doroteo GlassmanPhelps who finds living IUP, patient should begin prenatal care. Informed patient of results, reviewed dating, & provided pictures. Recommended she begin OB care. Patient verbalized understanding to all & had no questions

## 2017-05-02 ENCOUNTER — Other Ambulatory Visit (HOSPITAL_COMMUNITY)
Admission: RE | Admit: 2017-05-02 | Discharge: 2017-05-02 | Disposition: A | Discharge status: Home / Self Care | Payer: Medicaid Other | Source: Ambulatory Visit | Attending: Family Medicine | Admitting: Family Medicine

## 2017-05-02 ENCOUNTER — Ambulatory Visit (INDEPENDENT_AMBULATORY_CARE_PROVIDER_SITE_OTHER): Payer: Medicaid Other | Admitting: Family Medicine

## 2017-05-02 ENCOUNTER — Encounter: Payer: Self-pay | Admitting: Family Medicine

## 2017-05-02 VITALS — BP 110/63 | HR 75 | Wt 153.0 lb

## 2017-05-02 DIAGNOSIS — O210 Mild hyperemesis gravidarum: Secondary | ICD-10-CM

## 2017-05-02 DIAGNOSIS — Z3482 Encounter for supervision of other normal pregnancy, second trimester: Secondary | ICD-10-CM | POA: Insufficient documentation

## 2017-05-02 DIAGNOSIS — Z3491 Encounter for supervision of normal pregnancy, unspecified, first trimester: Secondary | ICD-10-CM

## 2017-05-02 DIAGNOSIS — Z3490 Encounter for supervision of normal pregnancy, unspecified, unspecified trimester: Secondary | ICD-10-CM | POA: Insufficient documentation

## 2017-05-02 LAB — POCT URINALYSIS DIP (DEVICE)
Glucose, UA: NEGATIVE mg/dL
HGB URINE DIPSTICK: NEGATIVE
Nitrite: NEGATIVE
Protein, ur: 30 mg/dL — AB
SPECIFIC GRAVITY, URINE: 1.025 (ref 1.005–1.030)
Urobilinogen, UA: 1 mg/dL (ref 0.0–1.0)
pH: 6 (ref 5.0–8.0)

## 2017-05-02 NOTE — Patient Instructions (Addendum)
For nausea/vomiting in pregnancy get this over the counter: - Vitamin B6 3 x day  If persistent symptoms, add : - Doxylamine (Unisom) at bedtime (this will be in the sleep aid section of the pharmacy)   Morning Sickness Morning sickness is when you feel sick to your stomach (nauseous) during pregnancy. This nauseous feeling may or may not come with vomiting. It often occurs in the morning but can be a problem any time of day. Morning sickness is most common during the first trimester, but it may continue throughout pregnancy. While morning sickness is unpleasant, it is usually harmless unless you develop severe and continual vomiting (hyperemesis gravidarum). This condition requires more intense treatment. What are the causes? The cause of morning sickness is not completely known but seems to be related to normal hormonal changes that occur in pregnancy. What increases the risk? You are at greater risk if you:  Experienced nausea or vomiting before your pregnancy.  Had morning sickness during a previous pregnancy.  Are pregnant with more than one baby, such as twins.  How is this treated? Do not use any medicines (prescription, over-the-counter, or herbal) for morning sickness without first talking to your health care provider. Your health care provider may prescribe or recommend:  Vitamin B6 supplements.  Anti-nausea medicines.  The herbal medicine ginger.  Follow these instructions at home:  Only take over-the-counter or prescription medicines as directed by your health care provider.  Taking multivitamins before getting pregnant can prevent or decrease the severity of morning sickness in most women.  Eat a piece of dry toast or unsalted crackers before getting out of bed in the morning.  Eat five or six small meals a day.  Eat dry and bland foods (rice, baked potato). Foods high in carbohydrates are often helpful.  Do not drink liquids with your meals. Drink liquids  between meals.  Avoid greasy, fatty, and spicy foods.  Get someone to cook for you if the smell of any food causes nausea and vomiting.  If you feel nauseous after taking prenatal vitamins, take the vitamins at night or with a snack.  Snack on protein foods (nuts, yogurt, cheese) between meals if you are hungry.  Eat unsweetened gelatins for desserts.  Wearing an acupressure wristband (worn for sea sickness) may be helpful.  Acupuncture may be helpful.  Do not smoke.  Get a humidifier to keep the air in your house free of odors.  Get plenty of fresh air. Contact a health care provider if:  Your home remedies are not working, and you need medicine.  You feel dizzy or lightheaded.  You are losing weight. Get help right away if:  You have persistent and uncontrolled nausea and vomiting.  You pass out (faint). This information is not intended to replace advice given to you by your health care provider. Make sure you discuss any questions you have with your health care provider. Document Released: 02/24/2006 Document Revised: 06/11/2015 Document Reviewed: 06/20/2012 Elsevier Interactive Patient Education  2017 [employer]

## 2017-05-02 NOTE — Progress Notes (Signed)
NEW PRENATAL VISIT NOTE  Subjective:  Jasmine Ritter is a [id number] at [redacted]w[redacted]d with Estimated Date of Delivery: 10/31/17 by LMP c/w 8-wk U/S being seen today for her first obstetrical visit.  Her obstetrical history is significant for prior twin gestation, but surrogate pregnancy. Patient does intend to breast feed. Pregnancy history fully reviewed.  Patient reports nausea and vomiting. Vomiting about once a day. Reports adequate po intake. No vaginal bleeding, or pelvic/abdominal pain.   BP 110/63   Pulse 75   Wt 153 lb (69.4 kg)   LMP 01/24/2017   BMI 26.26 kg/m   HISTORY: OB History  Gravida Para Term Preterm AB Living  3 2 2  0 0 3  SAB TAB Ectopic Multiple Live Births  0 0 0 1 3    # Outcome Date GA Lbr Len/2nd Weight Sex Delivery Anes PTL Lv  3 Current           2A Term 2015 [redacted]w[redacted]d    Vag-Spont        Complications: Surrogate pregnancy  2B Term 2015 [redacted]w[redacted]d    Vag-Spont        Complications: Surrogate pregnancy  1 Term 10/18/08 [redacted]w[redacted]d  7 lb 5 oz (3.317 kg) F Vag-Spont  N LIV    Past Medical History:  Diagnosis Date  . ADHD     Past Surgical History:  Procedure Laterality Date  . NO PAST SURGERIES      Family History  Problem Relation Age of Onset  . Cancer Mother   . Diabetes Paternal Grandfather     Objective:   Vitals:   05/02/17 1406  BP: 110/63  Pulse: 75  Weight: 153 lb (69.4 kg)    General appearance: Well nourished, well developed female in no acute distress.  Neck:  Supple, normal appearance, and no thyromegaly  Cardiovascular: normal rate, regular rhythm, no murmurs Respiratory:  Clear to auscultation bilaterally. Normal respiratory effort Abdomen: positive bowel sounds and no masses, hernias; diffusely non tender to palpation, non distended Breasts: breasts appear normal, no suspicious masses, no skin or nipple changes or axillary nodes. Neuro/Psych:  Normal mood and affect.  Skin:  Warm and dry.  Lymphatic:  No cervical or axillary  lymphadenopathy.  MSK: no LE edema  Pelvic exam: is not limited by body habitus EGBUS: within normal limits, Vagina: within normal limits and with no blood in the vault, Cervix: normal appearing cervix without discharge or lesions, closed/long/high, Uterus:  normal contour, 14-wk size uterus. and Adnexa:  normal adnexa and no mass, fullness, tenderness  Fetal Status: Fetal Heart Rate (bpm): 158    Movement: Absent      Assessment and Plan:  Pregnancy: G3P2003 at [redacted]w[redacted]d  1. Supervision of low-risk pregnancy, second trimester - Culture, OB Urine - Cystic fibrosis gene test - Cytology - PAP - Genetic Screening - SMN1 COPY NUMBER ANALYSIS (SMA Carrier Screen) - Obstetric Panel, Including HIV - [place] MFM OB COMP + 14 WK; Future - [facility] AMB BABYSCRIPTS SCHEDULE OPTIMIZATION  2. Morning sickness - Discussed Diclegis, but she thinks symptoms are improving, and might just try Vitamin B 6 over the counter.   Initial labs drawn. Continue prenatal vitamins. Genetic Screening discussed, First trimester screen, Quad screen and NIPS: ordered NIPS Ultrasound discussed; fetal anatomic survey: ordered. Problem list reviewed and updated. The nature of [facility] with multiple MDs and other Advanced Practice Providers was explained to patient; also emphasized that residents, students are part of  our team. Routine obstetric precautions reviewed. Return in about 1 month (around 05/30/2017) for LOB.    Raynelle FanningJulie P. Degele, MD [facility] (Faculty Practice)  Future Appointments  Date Time Provider Department Center  06/06/2017  1:15 PM WH-MFC US 4 WH-MFCUS MFC-US

## 2017-05-03 LAB — CYTOLOGY - PAP
Chlamydia: NEGATIVE
Diagnosis: NEGATIVE
Neisseria Gonorrhea: NEGATIVE

## 2017-05-04 LAB — URINE CULTURE, OB REFLEX

## 2017-05-04 LAB — CULTURE, OB URINE

## 2017-05-10 DIAGNOSIS — O210 Mild hyperemesis gravidarum: Secondary | ICD-10-CM

## 2017-05-10 DIAGNOSIS — Z3492 Encounter for supervision of normal pregnancy, unspecified, second trimester: Secondary | ICD-10-CM

## 2017-05-10 LAB — SMN1 COPY NUMBER ANALYSIS (SMA CARRIER SCREENING)

## 2017-05-10 LAB — OBSTETRIC PANEL, INCLUDING HIV
Antibody Screen: NEGATIVE
Basophils Absolute: 0 10*3/uL (ref 0.0–0.2)
Basos: 0 %
EOS (ABSOLUTE): 0.1 10*3/uL (ref 0.0–0.4)
Eos: 1 %
HIV Screen 4th Generation wRfx: NONREACTIVE
Hematocrit: 33.2 % — ABNORMAL LOW (ref 34.0–46.6)
Hemoglobin: 11.3 g/dL (ref 11.1–15.9)
Hepatitis B Surface Ag: NEGATIVE
IMMATURE GRANULOCYTES: 0 %
Immature Grans (Abs): 0 10*3/uL (ref 0.0–0.1)
Lymphocytes Absolute: 1.2 10*3/uL (ref 0.7–3.1)
Lymphs: 16 %
MCH: 30 pg (ref 26.6–33.0)
MCHC: 34 g/dL (ref 31.5–35.7)
MCV: 88 fL (ref 79–97)
MONOS ABS: 0.5 10*3/uL (ref 0.1–0.9)
Monocytes: 7 %
NEUTROS PCT: 76 %
Neutrophils Absolute: 5.4 10*3/uL (ref 1.4–7.0)
Platelets: 213 10*3/uL (ref 150–379)
RBC: 3.77 x10E6/uL (ref 3.77–5.28)
RDW: 13.2 % (ref 12.3–15.4)
RPR Ser Ql: NONREACTIVE
Rh Factor: POSITIVE
Rubella Antibodies, IGG: 2.31 index (ref >0.99)
WBC: 7.2 10*3/uL (ref 3.4–10.8)

## 2017-05-10 LAB — CYSTIC FIBROSIS GENE TEST

## 2017-05-11 ENCOUNTER — Telehealth: Payer: Self-pay | Admitting: *Deleted

## 2017-05-11 NOTE — Telephone Encounter (Signed)
Received a voicemail from this am stating they are calling from [place] re: [employer]- need to clarify ordering provider .case #[id number].  I called back and confirmed was Dr. Nira Retortegele. They will send us an order form for us to have her to sign.

## 2017-05-18 ENCOUNTER — Encounter: Payer: Self-pay | Admitting: *Deleted

## 2017-05-24 ENCOUNTER — Encounter: Payer: Self-pay | Admitting: *Deleted

## 2017-06-02 ENCOUNTER — Ambulatory Visit (INDEPENDENT_AMBULATORY_CARE_PROVIDER_SITE_OTHER): Payer: Medicaid Other | Admitting: Family Medicine

## 2017-06-02 VITALS — BP 112/63 | HR 72 | Wt 163.1 lb

## 2017-06-02 DIAGNOSIS — R350 Frequency of micturition: Secondary | ICD-10-CM

## 2017-06-02 DIAGNOSIS — Z3492 Encounter for supervision of normal pregnancy, unspecified, second trimester: Secondary | ICD-10-CM

## 2017-06-02 LAB — POCT URINALYSIS DIP (DEVICE)
Bilirubin Urine: NEGATIVE
Glucose, UA: NEGATIVE mg/dL
HGB URINE DIPSTICK: NEGATIVE
KETONES UR: NEGATIVE mg/dL
NITRITE: NEGATIVE
PH: 7 (ref 5.0–8.0)
Protein, ur: NEGATIVE mg/dL
SPECIFIC GRAVITY, URINE: 1.02 (ref 1.005–1.030)
Urobilinogen, UA: 0.2 mg/dL (ref 0.0–1.0)

## 2017-06-02 NOTE — Progress Notes (Signed)
Pt states when used the bathroom last night had some pain

## 2017-06-02 NOTE — Patient Instructions (Signed)
Second Trimester of Pregnancy The second trimester is from week 13 through week 28, month 4 through 6. This is often the time in pregnancy that you feel your best. Often times, morning sickness has lessened or quit. You may have more energy, and you may get hungry more often. Your unborn baby (fetus) is growing rapidly. At the end of the sixth month, he or she is about 9 inches long and weighs about 1 pounds. You will likely feel the baby move (quickening) between 18 and 20 weeks of pregnancy. Follow these instructions at home:  Avoid all smoking, herbs, and alcohol. Avoid drugs not approved by your doctor.  Do not use any tobacco products, including cigarettes, chewing tobacco, and electronic cigarettes. If you need help quitting, ask your doctor. You may get counseling or other support to help you quit.  Only take medicine as told by your doctor. Some medicines are safe and some are not during pregnancy.  Exercise only as told by your doctor. Stop exercising if you start having cramps.  Eat regular, healthy meals.  Wear a good support bra if your breasts are tender.  Do not use hot tubs, steam rooms, or saunas.  Wear your seat belt when driving.  Avoid raw meat, uncooked cheese, and liter boxes and soil used by cats.  Take your prenatal vitamins.  Take 1500-2000 milligrams of calcium daily starting at the 20th week of pregnancy until you deliver your baby.  Try taking medicine that helps you poop (stool softener) as needed, and if your doctor approves. Eat more fiber by eating fresh fruit, vegetables, and whole grains. Drink enough fluids to keep your pee (urine) clear or pale yellow.  Take warm water baths (sitz baths) to soothe pain or discomfort caused by hemorrhoids. Use hemorrhoid cream if your doctor approves.  If you have puffy, bulging veins (varicose veins), wear support hose. Raise (elevate) your feet for 15 minutes, 3-4 times a day. Limit salt in your diet.  Avoid heavy  lifting, wear low heals, and sit up straight.  Rest with your legs raised if you have leg cramps or low back pain.  Visit your dentist if you have not gone during your pregnancy. Use a soft toothbrush to brush your teeth. Be gentle when you floss.  You can have sex (intercourse) unless your doctor tells you not to.  Go to your doctor visits. Get help if:  You feel dizzy.  You have mild cramps or pressure in your lower belly (abdomen).  You have a nagging pain in your belly area.  You continue to feel sick to your stomach (nauseous), throw up (vomit), or have watery poop (diarrhea).  You have bad smelling fluid coming from your vagina.  You have pain with peeing (urination). Get help right away if:  You have a fever.  You are leaking fluid from your vagina.  You have spotting or bleeding from your vagina.  You have severe belly cramping or pain.  You lose or gain weight rapidly.  You have trouble catching your breath and have chest pain.  You notice sudden or extreme puffiness (swelling) of your face, hands, ankles, feet, or legs.  You have not felt the baby move in over an hour.  You have severe headaches that do not go away with medicine.  You have vision changes. This information is not intended to replace advice given to you by your health care provider. Make sure you discuss any questions you have with your health care   provider. Document Released: 03/30/2009 Document Revised: 06/11/2015 Document Reviewed: 03/06/2012 Elsevier Interactive Patient Education  2017 Elsevier Inc.  

## 2017-06-02 NOTE — Progress Notes (Signed)
   PRENATAL VISIT NOTE  Subjective:  Jasmine Ritter is a 32 y.o. G3P2003 at [redacted]w[redacted]d being seen today for ongoing prenatal care.  She is currently monitored for the following issues for this low-risk pregnancy and has Severe major depression without psychotic features (HCC); Supervision of low-risk pregnancy; and Morning sickness on their problem list.  Patient reports pain with urination last night, and endorses frequency.  Contractions: Not present. Vag. Bleeding: None.  Movement: Absent. Denies leaking of fluid.   The following portions of the patient's history were reviewed and updated as appropriate: allergies, current medications, past family history, past medical history, past social history, past surgical history and problem list. Problem list updated.  Objective:   Vitals:   06/02/17 1455  BP: 112/63  Pulse: 72  Weight: 163 lb 1.6 oz (74 kg)    Fetal Status: Fetal Heart Rate (bpm): 148   Movement: Absent     General:  Alert, oriented and cooperative. Patient is in no acute distress.  Skin: Skin is warm and dry. No rash noted.   Cardiovascular: Normal heart rate noted  Respiratory: Normal respiratory effort, no problems with respiration noted  Abdomen: Soft, gravid, appropriate for gestational age.  Pain/Pressure: Present     Pelvic: Cervical exam deferred        Extremities: Normal range of motion.  Edema: None  Mental Status: Normal mood and affect. Normal behavior. Normal judgment and thought content.   Assessment and Plan:  Pregnancy: G3P2003 at [redacted]w[redacted]d  1. Encounter for supervision of low-risk pregnancy in second trimester - Routine care - Anatomy scan on 5/21  2. Urinary frequency: UA unremarkable - Send urine for culture  Preterm labor symptoms and general obstetric precautions including but not limited to vaginal bleeding, contractions, leaking of fluid and fetal movement were reviewed in detail with the patient. Please refer to After Visit Summary for other  counseling recommendations.  Return in about 1 month (around 06/30/2017) for LOB.  Future Appointments  Date Time Provider Department Center  06/06/2017  1:15 PM WH-MFC [place] 4 WH-MFCUS MFC-US  06/23/2017  4:15 PM Seleta Hovland, Kandra Nicolas, MD WOC-WOCA WOC  07/14/2017  4:15 PM Burleson, Brand Males, NP WOC-WOCA WOC    Frederik Pear, MD

## 2017-06-05 LAB — URINE CULTURE, OB REFLEX

## 2017-06-05 LAB — CULTURE, OB URINE

## 2017-06-06 ENCOUNTER — Ambulatory Visit (HOSPITAL_COMMUNITY)
Admission: RE | Admit: 2017-06-06 | Discharge: 2017-06-06 | Disposition: A | Discharge status: Home / Self Care | Payer: Medicaid Other | Source: Ambulatory Visit | Attending: Family Medicine | Admitting: Family Medicine

## 2017-06-06 ENCOUNTER — Other Ambulatory Visit: Payer: Self-pay | Admitting: Family Medicine

## 2017-06-06 DIAGNOSIS — Z363 Encounter for antenatal screening for malformations: Secondary | ICD-10-CM

## 2017-06-06 DIAGNOSIS — Z3A19 19 weeks gestation of pregnancy: Secondary | ICD-10-CM

## 2017-06-06 DIAGNOSIS — Z3491 Encounter for supervision of normal pregnancy, unspecified, first trimester: Secondary | ICD-10-CM

## 2017-06-13 ENCOUNTER — Encounter: Payer: Self-pay | Admitting: *Deleted

## 2017-06-23 ENCOUNTER — Ambulatory Visit (INDEPENDENT_AMBULATORY_CARE_PROVIDER_SITE_OTHER): Payer: Medicaid Other | Admitting: Family Medicine

## 2017-06-23 VITALS — BP 118/65 | HR 77 | Wt 168.0 lb

## 2017-06-23 DIAGNOSIS — Z3482 Encounter for supervision of other normal pregnancy, second trimester: Secondary | ICD-10-CM

## 2017-06-23 DIAGNOSIS — Z3490 Encounter for supervision of normal pregnancy, unspecified, unspecified trimester: Secondary | ICD-10-CM

## 2017-06-23 NOTE — Progress Notes (Signed)
   PRENATAL VISIT NOTE  Subjective:  Jasmine Ritter is a 32 y.o. G3P2003 at 5295w3d being seen today for ongoing prenatal care.  She is currently monitored for the following issues for this low-risk pregnancy and has Severe major depression without psychotic features (HCC); Supervision of low-risk pregnancy; and Morning sickness on their problem list.  Patient reports no complaints.  Contractions: Not present. Vag. Bleeding: None.   . Denies leaking of fluid.   The following portions of the patient's history were reviewed and updated as appropriate: allergies, current medications, past family history, past medical history, past social history, past surgical history and problem list. Problem list updated.  Objective:   Vitals:   06/23/17 1614  BP: 118/65  Pulse: 77  Weight: 168 lb (76.2 kg)    Fetal Status: Fetal Heart Rate (bpm): 152         General:  Alert, oriented and cooperative. Patient is in no acute distress.  Skin: Skin is warm and dry. No rash noted.   Cardiovascular: Normal heart rate noted  Respiratory: Normal respiratory effort, no problems with respiration noted  Abdomen: Soft, gravid, appropriate for gestational age.  Pain/Pressure: Present     Pelvic: Cervical exam deferred        Extremities: Normal range of motion.  Edema: Trace  Mental Status: Normal mood and affect. Normal behavior. Normal judgment and thought content.   Assessment and Plan:  Pregnancy: G3P2003 at 995w3d  1. Encounter for supervision of low-risk pregnancy, antepartum Doing well. Continue routine care.  Follow up at 28 weeks (On Babyscripts). - US MFM OB FOLLOW UP; Future - follow up on reassesment of fetal spine - AFP, Serum, Open Spina Bifida   Preterm labor symptoms and general obstetric precautions including but not limited to vaginal bleeding, contractions, leaking of fluid and fetal movement were reviewed in detail with the patient. Please refer to After Visit Summary for other  counseling recommendations.  Return in about 6 weeks (around 08/04/2017) for 28-wk visit with labs/2-hr GTT.  Future Appointments  Date Time Provider Department Center  07/18/2017  3:45 PM WH-MFC US 2 WH-MFCUS MFC-US  08/02/2017  8:20 AM WOC-WOCA LAB WOC-WOCA WOC  08/02/2017  8:55 AM Kaysin Brock, Kandra NicolasJulie P, MD WOC-WOCA WOC    Frederik PearJulie P Dezaray Shibuya, MD

## 2017-06-23 NOTE — Patient Instructions (Addendum)
Second Trimester of Pregnancy The second trimester is from week 13 through week 28, month 4 through 6. This is often the time in pregnancy that you feel your best. Often times, morning sickness has lessened or quit. You may have more energy, and you may get hungry more often. Your unborn baby (fetus) is growing rapidly. At the end of the sixth month, he or she is about 9 inches long and weighs about 1 pounds. You will likely feel the baby move (quickening) between 18 and 20 weeks of pregnancy. Follow these instructions at home:  Avoid all smoking, herbs, and alcohol. Avoid drugs not approved by your doctor.  Do not use any tobacco products, including cigarettes, chewing tobacco, and electronic cigarettes. If you need help quitting, ask your doctor. You may get counseling or other support to help you quit.  Only take medicine as told by your doctor. Some medicines are safe and some are not during pregnancy.  Exercise only as told by your doctor. Stop exercising if you start having cramps.  Eat regular, healthy meals.  Wear a good support bra if your breasts are tender.  Do not use hot tubs, steam rooms, or saunas.  Wear your seat belt when driving.  Avoid raw meat, uncooked cheese, and liter boxes and soil used by cats.  Take your prenatal vitamins.  Take 1500-2000 milligrams of calcium daily starting at the 20th week of pregnancy until you deliver your baby.  Try taking medicine that helps you poop (stool softener) as needed, and if your doctor approves. Eat more fiber by eating fresh fruit, vegetables, and whole grains. Drink enough fluids to keep your pee (urine) clear or pale yellow.  Take warm water baths (sitz baths) to soothe pain or discomfort caused by hemorrhoids. Use hemorrhoid cream if your doctor approves.  If you have puffy, bulging veins (varicose veins), wear support hose. Raise (elevate) your feet for 15 minutes, 3-4 times a day. Limit salt in your diet.  Avoid heavy  lifting, wear low heals, and sit up straight.  Rest with your legs raised if you have leg cramps or low back pain.  Visit your dentist if you have not gone during your pregnancy. Use a soft toothbrush to brush your teeth. Be gentle when you floss.  You can have sex (intercourse) unless your doctor tells you not to.  Go to your doctor visits. Get help if:  You feel dizzy.  You have mild cramps or pressure in your lower belly (abdomen).  You have a nagging pain in your belly area.  You continue to feel sick to your stomach (nauseous), throw up (vomit), or have watery poop (diarrhea).  You have bad smelling fluid coming from your vagina.  You have pain with peeing (urination). Get help right away if:  You have a fever.  You are leaking fluid from your vagina.  You have spotting or bleeding from your vagina.  You have severe belly cramping or pain.  You lose or gain weight rapidly.  You have trouble catching your breath and have chest pain.  You notice sudden or extreme puffiness (swelling) of your face, hands, ankles, feet, or legs.  You have not felt the baby move in over an hour.  You have severe headaches that do not go away with medicine.  You have vision changes. This information is not intended to replace advice given to you by your health care provider. Make sure you discuss any questions you have with your health care   provider. Document Released: 03/30/2009 Document Revised: 06/11/2015 Document Reviewed: 03/06/2012 Elsevier Interactive Patient Education  2017 [employer]   Places to have your son circumcised:    [facility] [phone] while you are in hospital  [facility] [phone] $244 by 4 wks  [facility] [phone] $175 by 2 wks  [facility] [phone] $250 by 7 days [facility]  [phone] $269 by 4 wks  These prices sometimes change but are roughly what you can expect to pay. Please call and confirm pricing.   Circumcision is considered an elective/non-medically necessary procedure. There are many reasons parents decide to have their sons circumsized. During the first year of life circumcised males have a reduced risk of urinary tract infections but after this year the rates between circumcised males and uncircumcised males are the same.  It is safe to have your son circumcised outside of the hospital and the places above perform them regularly.   Deciding about Circumcision in Baby Boys  (Up-to-date The Basics)  What is circumcision?  Circumcision is a surgery that removes the skin that covers the tip of the penis, called the "foreskin" Circumcision is usually done when a boy is between 681 and 2110 days old. In the [place], circumcision is common. In some other countries, fewer boys are circumcised. Circumcision is a common tradition in some religions.  Should I have my baby boy circumcised?  There is no easy answer. Circumcision has some benefits. But it also has risks. After talking with your doctor, you will have to decide for yourself what is right for your family.  What are the benefits of circumcision?  Circumcised boys seem to have slightly lower rates of: ?Urinary tract infections ?Swelling of the opening at the tip of the penis Circumcised men seem to have slightly lower rates of: ?Urinary tract infections ?Swelling of the opening at the tip of the penis ?Penis cancer ?HIV and other infections that you catch during sex ?Cervical cancer in the women they have sex with Even so, in the [place], the risks of these problems are small - even in boys and men who have not been circumcised. Plus, boys and men who are not circumcised can reduce these extra risks by: ?Cleaning their penis well ?Using condoms during sex  What are the risks of  circumcision?  Risks include: ?Bleeding or infection from the surgery ?Damage to or amputation of the penis ?A chance that the doctor will cut off too much or not enough of the foreskin ?A chance that sex won't feel as good later in life Only about 1 out of every 200 circumcisions leads to problems. There is also a chance that your health insurance won't pay for circumcision.  How is circumcision done in baby boys?  First, the baby gets medicine for pain relief. This might be a cream on the skin or a shot into the base of the penis. Next, the doctor cleans the baby's penis well. Then he or she uses special tools to cut off the foreskin. Finally, the doctor wraps a bandage (called gauze) around the baby's penis. If you have your baby circumcised, his doctor or nurse will give you instructions on how to care for him after the surgery. It is important that you follow those instructions carefully.

## 2017-06-25 LAB — AFP, SERUM, OPEN SPINA BIFIDA
AFP MoM: 0.9
AFP VALUE AFPOSL: 55.4 ng/mL
Gest. Age on Collection Date: 21.4 weeks
Maternal Age At EDD: 29.8 yr
OSBR RISK 1 IN: 10000
Test Results:: NEGATIVE
Weight: 168 [lb_av]

## 2017-07-06 ENCOUNTER — Telehealth: Payer: Self-pay | Admitting: Obstetrics and Gynecology

## 2017-07-06 NOTE — Telephone Encounter (Signed)
Received call from Babyscripts re: critical BP for patient, 114/100. Called patient, no answer. Left message that we will have her come in for BP tomorrow and to call with any issues.    K. Meryl Deaira Leckey, M.D. Attending Obstetrician & Gynecologist, [facility] for [facility], [facility]

## 2017-07-14 ENCOUNTER — Encounter: Payer: Self-pay | Admitting: Nurse Practitioner

## 2017-07-14 ENCOUNTER — Ambulatory Visit (INDEPENDENT_AMBULATORY_CARE_PROVIDER_SITE_OTHER): Payer: Medicaid Other | Admitting: *Deleted

## 2017-07-14 VITALS — BP 104/68 | Wt 169.5 lb

## 2017-07-14 DIAGNOSIS — R030 Elevated blood-pressure reading, without diagnosis of hypertension: Secondary | ICD-10-CM

## 2017-07-14 DIAGNOSIS — Z3490 Encounter for supervision of normal pregnancy, unspecified, unspecified trimester: Secondary | ICD-10-CM

## 2017-07-14 MED ORDER — COMFORT FIT MATERNITY SUPP SM MISC: 1.0000 [IU] | Freq: Once | 0 refills | Status: DC

## 2017-07-14 MED ORDER — COMFORT FIT MATERNITY SUPP SM MISC: 1.0000 [IU] | Freq: Once | 0 refills | Status: AC

## 2017-07-14 NOTE — Progress Notes (Signed)
I have reviewed the chart and agree with nursing staff's documentation of this patient's encounter.  Catalina AntiguaPeggy Yuval Rubens, MD 07/14/2017 4:30 PM

## 2017-07-14 NOTE — Progress Notes (Signed)
Here for bp check today due to elevated bp on babyscripts app. C/o lower back pain. Reviewed her bp on her app and she only had that one elevated , otherwise wnl. Given rx for maternity support belt. Reviewed bp today and per app with Dr. Jolayne Pantheronstant. Orders to keep obfu as scheduled 7/17 and to call us if any more elevated bp's at home; or come to mau severe headache or sudden edema. Patient voices understanding.

## 2017-07-18 ENCOUNTER — Other Ambulatory Visit: Payer: Self-pay | Admitting: Family Medicine

## 2017-07-18 ENCOUNTER — Ambulatory Visit (HOSPITAL_COMMUNITY)
Admission: RE | Admit: 2017-07-18 | Discharge: 2017-07-18 | Disposition: A | Discharge status: Home / Self Care | Payer: Medicaid Other | Source: Ambulatory Visit | Attending: Family Medicine | Admitting: Family Medicine

## 2017-07-18 DIAGNOSIS — IMO0002 Reserved for concepts with insufficient information to code with codable children: Secondary | ICD-10-CM

## 2017-07-18 DIAGNOSIS — Z362 Encounter for other antenatal screening follow-up: Secondary | ICD-10-CM | POA: Insufficient documentation

## 2017-07-18 DIAGNOSIS — Z3482 Encounter for supervision of other normal pregnancy, second trimester: Secondary | ICD-10-CM | POA: Diagnosis not present

## 2017-07-18 DIAGNOSIS — Z0489 Encounter for examination and observation for other specified reasons: Secondary | ICD-10-CM

## 2017-07-18 DIAGNOSIS — Z3490 Encounter for supervision of normal pregnancy, unspecified, unspecified trimester: Secondary | ICD-10-CM

## 2017-07-18 DIAGNOSIS — Z3A25 25 weeks gestation of pregnancy: Secondary | ICD-10-CM | POA: Diagnosis present

## 2017-07-18 DIAGNOSIS — O283 Abnormal ultrasonic finding on antenatal screening of mother: Secondary | ICD-10-CM | POA: Diagnosis present

## 2017-07-24 ENCOUNTER — Encounter: Payer: Self-pay | Admitting: *Deleted

## 2017-07-31 ENCOUNTER — Other Ambulatory Visit: Payer: Self-pay | Admitting: *Deleted

## 2017-07-31 DIAGNOSIS — Z3490 Encounter for supervision of normal pregnancy, unspecified, unspecified trimester: Secondary | ICD-10-CM

## 2017-08-02 ENCOUNTER — Other Ambulatory Visit: Payer: Medicaid Other

## 2017-08-02 ENCOUNTER — Other Ambulatory Visit (HOSPITAL_COMMUNITY)
Admission: RE | Admit: 2017-08-02 | Discharge: 2017-08-02 | Disposition: A | Discharge status: Home / Self Care | Payer: Medicaid Other | Source: Ambulatory Visit | Attending: Family Medicine | Admitting: Family Medicine

## 2017-08-02 ENCOUNTER — Ambulatory Visit (INDEPENDENT_AMBULATORY_CARE_PROVIDER_SITE_OTHER): Payer: Medicaid Other | Admitting: Family Medicine

## 2017-08-02 VITALS — BP 111/80 | HR 87 | Wt 171.8 lb

## 2017-08-02 DIAGNOSIS — Z3490 Encounter for supervision of normal pregnancy, unspecified, unspecified trimester: Secondary | ICD-10-CM

## 2017-08-02 DIAGNOSIS — N898 Other specified noninflammatory disorders of vagina: Secondary | ICD-10-CM | POA: Diagnosis present

## 2017-08-02 DIAGNOSIS — Z3482 Encounter for supervision of other normal pregnancy, second trimester: Secondary | ICD-10-CM

## 2017-08-02 DIAGNOSIS — O9989 Other specified diseases and conditions complicating pregnancy, childbirth and the puerperium: Secondary | ICD-10-CM

## 2017-08-02 NOTE — Patient Instructions (Addendum)
Rooming In Your baby will stay in your room with you for the entire time you are in the hospital. This helps with the following: . Allows Mom to learn baby's feeding cues - Fluttering eyes - Sucking on tongue or hand - Rooting (opens mouth and turns head) - Nuzzling into the breast - Bringing hand to mouth . Allows breastfeeding on demand (when your baby is ready) . Helps baby to be calm and content . Ensures a good milk supply . Prevents complications with breastfeeding . Allows parents to learn to care for baby . Allows you to request assistance with breastfeeding  Places to have your son circumcised:    [facility] [phone] $480 while you are in hospital  [facility] [phone] $244 by 4 wks  [facility] [phone] $175 by 2 wks  [facility] [phone] $250 by 7 days [facility] [phone] $269 by 4 wks  These prices sometimes change but are roughly what you can expect to pay. Please call and confirm pricing.   Circumcision is considered an elective/non-medically necessary procedure. There are many reasons parents decide to have their sons circumsized. During the first year of life circumcised males have a reduced risk of urinary tract infections but after this year the rates between circumcised males and uncircumcised males are the same.  It is safe to have your son circumcised outside of the hospital and the places above perform them regularly.   Deciding about Circumcision in Baby Boys  (Up-to-date The Basics)  What is circumcision?  Circumcision is a surgery that removes the skin that covers the tip of the penis, called the "foreskin" Circumcision is usually done when a boy is between 791 and 5510 days old. In the [place], circumcision is common. In some other countries, fewer boys are circumcised. Circumcision  is a common tradition in some religions.  Should I have my baby boy circumcised?  There is no easy answer. Circumcision has some benefits. But it also has risks. After talking with your doctor, you will have to decide for yourself what is right for your family.  What are the benefits of circumcision?  Circumcised boys seem to have slightly lower rates of: ?Urinary tract infections ?Swelling of the opening at the tip of the penis Circumcised men seem to have slightly lower rates of: ?Urinary tract infections ?Swelling of the opening at the tip of the penis ?Penis cancer ?HIV and other infections that you catch during sex ?Cervical cancer in the women they have sex with Even so, in the [place], the risks of these problems are small - even in boys and men who have not been circumcised. Plus, boys and men who are not circumcised can reduce these extra risks by: ?Cleaning their penis well ?Using condoms during sex  What are the risks of circumcision?  Risks include: ?Bleeding or infection from the surgery ?Damage to or amputation of the penis ?A chance that the doctor will cut off too much or not enough of the foreskin ?A chance that sex won't feel as good later in life Only about 1 out of every 200 circumcisions leads to problems. There is also a chance that your health insurance won't pay for circumcision.  How is circumcision done in baby boys?  First, the baby gets medicine for pain relief. This might be a cream on the skin or a shot into the base of the penis. Next, the doctor cleans the baby's penis well. Then he or she uses special tools to cut off  the foreskin. Finally, the doctor wraps a bandage (called gauze) around the baby's penis. If you have your baby circumcised, his doctor or nurse will give you instructions on how to care for him after the surgery. It is important that you follow those instructions carefully.   Third Trimester of Pregnancy The third trimester  is from week 29 through week 42, months 7 through 9. This trimester is when your unborn baby (fetus) is growing very fast. At the end of the ninth month, the unborn baby is about 20 inches in length. It weighs about 6-10 pounds. Follow these instructions at home:  Avoid all smoking, herbs, and alcohol. Avoid drugs not approved by your doctor.  Do not use any tobacco products, including cigarettes, chewing tobacco, and electronic cigarettes. If you need help quitting, ask your doctor. You may get counseling or other support to help you quit.  Only take medicine as told by your doctor. Some medicines are safe and some are not during pregnancy.  Exercise only as told by your doctor. Stop exercising if you start having cramps.  Eat regular, healthy meals.  Wear a good support bra if your breasts are tender.  Do not use hot tubs, steam rooms, or saunas.  Wear your seat belt when driving.  Avoid raw meat, uncooked cheese, and liter boxes and soil used by cats.  Take your prenatal vitamins.  Take 1500-2000 milligrams of calcium daily starting at the 20th week of pregnancy until you deliver your baby.  Try taking medicine that helps you poop (stool softener) as needed, and if your doctor approves. Eat more fiber by eating fresh fruit, vegetables, and whole grains. Drink enough fluids to keep your pee (urine) clear or pale yellow.  Take warm water baths (sitz baths) to soothe pain or discomfort caused by hemorrhoids. Use hemorrhoid cream if your doctor approves.  If you have puffy, bulging veins (varicose veins), wear support hose. Raise (elevate) your feet for 15 minutes, 3-4 times a day. Limit salt in your diet.  Avoid heavy lifting, wear low heels, and sit up straight.  Rest with your legs raised if you have leg cramps or low back pain.  Visit your dentist if you have not gone during your pregnancy. Use a soft toothbrush to brush your teeth. Be gentle when you floss.  You can have sex  (intercourse) unless your doctor tells you not to.  Do not travel far distances unless you must. Only do so with your doctor's approval.  Take prenatal classes.  Practice driving to the hospital.  Pack your hospital bag.  Prepare the baby's room.  Go to your doctor visits. Get help if:  You are not sure if you are in labor or if your water has broken.  You are dizzy.  You have mild cramps or pressure in your lower belly (abdominal).  You have a nagging pain in your belly area.  You continue to feel sick to your stomach (nauseous), throw up (vomit), or have watery poop (diarrhea).  You have bad smelling fluid coming from your vagina.  You have pain with peeing (urination). Get help right away if:  You have a fever.  You are leaking fluid from your vagina.  You are spotting or bleeding from your vagina.  You have severe belly cramping or pain.  You lose or gain weight rapidly.  You have trouble catching your breath and have chest pain.  You notice sudden or extreme puffiness (swelling) of your face, hands, ankles,  feet, or legs.  You have not felt the baby move in over an hour.  You have severe headaches that do not go away with medicine.  You have vision changes. This information is not intended to replace advice given to you by your health care provider. Make sure you discuss any questions you have with your health care provider. Document Released: 03/30/2009 Document Revised: 06/11/2015 Document Reviewed: 03/06/2012 Elsevier Interactive Patient Education  2017 [employer].   Contraception Choices Contraception, also called birth control, means things to use or ways to try not to get pregnant. Hormonal birth control This kind of birth control uses hormones. Here are some types of hormonal birth control:  A tube that is put under skin of the arm (implant). The tube can stay in for as long as 3 years.  Shots to get every 3 months (injections).  Pills to  take every day (birth control pills).  A patch to change 1 time each week for 3 weeks (birth control patch). After that, the patch is taken off for 1 week.  A ring to put in the vagina. The ring is left in for 3 weeks. Then it is taken out of the vagina for 1 week. Then a new ring is put in.  Pills to take after unprotected sex (emergency birth control pills).  Barrier birth control Here are some types of barrier birth control:  A thin covering that is put on the penis before sex (female condom). The covering is thrown away after sex.  A soft, loose covering that is put in the vagina before sex (female condom). The covering is thrown away after sex.  A rubber bowl that sits over the cervix (diaphragm). The bowl must be made for you. The bowl is put into the vagina before sex. The bowl is left in for 6-8 hours after sex. It is taken out within 24 hours.  A small, soft cup that fits over the cervix (cervical cap). The cup must be made for you. The cup can be left in for 6-8 hours after sex. It is taken out within 48 hours.  A sponge that is put into the vagina before sex. It must be left in for at least 6 hours after sex. It must be taken out within 30 hours. Then it is thrown away.  A chemical that kills or stops sperm from getting into the uterus (spermicide). It may be a pill, cream, jelly, or foam to put in the vagina. The chemical should be used at least 10-15 minutes before sex.  IUD (intrauterine) birth control An IUD is a small, T-shaped piece of plastic. It is put inside the uterus. There are two kinds:  Hormone IUD. This kind can stay in for 3-5 years.  Copper IUD. This kind can stay in for 10 years.  Permanent birth control Here are some types of permanent birth control:  Surgery to block the fallopian tubes.  Having an insert put into each fallopian tube.  Surgery to tie off the tubes that carry sperm (vasectomy).  Natural planning birth control Here are some types of  natural planning birth control:  Not having sex on the days the woman could get pregnant.  Using a calendar: ? To keep track of the length of each period. ? To find out what days pregnancy can happen. ? To plan to not have sex on days when pregnancy can happen.  Watching for symptoms of ovulation and not having sex during ovulation. One way  the woman can check for ovulation is to check her temperature.  Waiting to have sex until after ovulation.  Summary  Contraception, also called birth control, means things to use or ways to try not to get pregnant.  Hormonal methods of birth control include implants, injections, pills, patches, vaginal rings, and emergency birth control pills.  Barrier methods of birth control can include female condoms, female condoms, diaphragms, cervical caps, sponges, and spermicides.  There are two types of IUD (intrauterine device) birth control. An IUD can be put in a woman's uterus to prevent pregnancy for 3-5 years.  Permanent sterilization can be done through a procedure for males, females, or both.  Natural planning methods involve not having sex on the days when the woman could get pregnant. This information is not intended to replace advice given to you by your health care provider. Make sure you discuss any questions you have with your health care provider. Document Released: 10/31/2008 Document Revised: 01/14/2016 Document Reviewed: 01/14/2016 Elsevier Interactive Patient Education  2017 [employer].   Website to review contraceptive methods: bedsider.org

## 2017-08-02 NOTE — Progress Notes (Signed)
Declined tdap vaccine.Declined to complete phq9/gad7.

## 2017-08-02 NOTE — Progress Notes (Addendum)
   PRENATAL VISIT NOTE  Subjective:  Jasmine Ritter is a 32 y.o. G3P2003 at 2886w1d being seen today for ongoing prenatal care.  She is currently monitored for the following issues for this low-risk pregnancy and has Severe major depression without psychotic features (HCC); Supervision of low-risk pregnancy; and Morning sickness on their problem list.  Patient reports vaginal irritation with sexual intercourse, no vaginal discharge.  Contractions: Not present. Vag. Bleeding: None.  Movement: Present. Denies leaking of fluid.   The following portions of the patient's history were reviewed and updated as appropriate: allergies, current medications, past family history, past medical history, past social history, past surgical history and problem list. Problem list updated.  Objective:   Vitals:   08/02/17 0844  BP: 111/80  Pulse: 87  Weight: 171 lb 12.8 oz (77.9 kg)    Fetal Status: Fetal Heart Rate (bpm): 137 Fundal Height: 27 cm Movement: Present     General:  Alert, oriented and cooperative. Patient is in no acute distress.  Skin: Skin is warm and dry. No rash noted.   Cardiovascular: Normal heart rate noted  Respiratory: Normal respiratory effort, no problems with respiration noted  Abdomen: Soft, gravid, appropriate for gestational age.  Pain/Pressure: Absent     Pelvic: Cervical exam deferred        Extremities: Normal range of motion.  Edema: Trace  Mental Status: Normal mood and affect. Normal behavior. Normal judgment and thought content.   Assessment and Plan:  Pregnancy: G3P2003 at 6386w1d  1. Encounter for supervision of low-risk pregnancy, antepartum - 28-week labs with 2hr GTT today - Counseled on recommendation and benefits of getting Tdap vaccine; she declines - Discussed circumcision and contraception (does not want LARCs, likely will do POPs)  2. Hx of depression: mood has been good this pregnancy. Denies any depression or anxiety symptoms. Advised about available  behavioral health services  3. Vaginal irritation - Swab collected for wet prep  Preterm labor symptoms and general obstetric precautions including but not limited to vaginal bleeding, contractions, leaking of fluid and fetal movement were reviewed in detail with the patient. Please refer to After Visit Summary for other counseling recommendations.  Return in about 1 month (around 08/30/2017) for LOB.  Future Appointments  Date Time Provider Department Center  09/01/2017  1:15 PM Sharyon Cableogers, Veronica C, CNM [facility]    Frederik PearJulie P Casie Sturgeon, MD

## 2017-08-02 NOTE — Addendum Note (Signed)
Addended by: Gerome ApleyZEYFANG, LINDA L on: 08/02/2017 09:46 AM   Modules accepted: Orders

## 2017-08-03 LAB — HIV ANTIBODY (ROUTINE TESTING W REFLEX): HIV SCREEN 4TH GENERATION: NONREACTIVE

## 2017-08-03 LAB — CBC
Hematocrit: 30.3 % — ABNORMAL LOW (ref 34.0–46.6)
Hemoglobin: 10.3 g/dL — ABNORMAL LOW (ref 11.1–15.9)
MCH: 29.6 pg (ref 26.6–33.0)
MCHC: 34 g/dL (ref 31.5–35.7)
MCV: 87 fL (ref 79–97)
PLATELETS: 163 10*3/uL (ref 150–450)
RBC: 3.48 x10E6/uL — ABNORMAL LOW (ref 3.77–5.28)
RDW: 12.8 % (ref 12.3–15.4)
WBC: 7.4 10*3/uL (ref 3.4–10.8)

## 2017-08-03 LAB — GLUCOSE TOLERANCE, 2 HOURS W/ 1HR
GLUCOSE, 1 HOUR: 140 mg/dL (ref 65–179)
GLUCOSE, FASTING: 94 mg/dL — AB (ref 65–91)
Glucose, 2 hour: 120 mg/dL (ref 65–152)

## 2017-08-03 LAB — RPR: RPR Ser Ql: NONREACTIVE

## 2017-08-03 LAB — CERVICOVAGINAL ANCILLARY ONLY
Bacterial vaginitis: NEGATIVE
Candida vaginitis: NEGATIVE

## 2017-08-14 ENCOUNTER — Telehealth: Payer: Self-pay | Admitting: *Deleted

## 2017-08-14 NOTE — Telephone Encounter (Signed)
I called Jasmine Ritter and notified her of elevated fasting level of 2 hr gtt and doctor offers she may choose to recheck 1 hr gtt or come in and be taught how to monitor cbg's.  She declines both.  Will forward to provider.

## 2017-08-14 NOTE — Telephone Encounter (Signed)
-----   Message from Frederik PearJulie P Degele, MD sent at 08/11/2017  5:02 PM EDT ----- 2-hr GTT with elevated fasting glucose only (all others wnl).  Spoke with Dr. Vergie LivingPickens. Okay to offer pt to just monitor CBGs or offer to do a 1-hr Glucola to re-test. Please inform patient.   Mild anemia - start ferrous sulfate po

## 2017-09-01 ENCOUNTER — Encounter: Payer: Self-pay | Admitting: Certified Nurse Midwife

## 2017-09-01 ENCOUNTER — Ambulatory Visit (INDEPENDENT_AMBULATORY_CARE_PROVIDER_SITE_OTHER): Payer: Medicaid Other | Admitting: Certified Nurse Midwife

## 2017-09-01 VITALS — BP 115/60 | HR 72 | Wt 176.1 lb

## 2017-09-01 DIAGNOSIS — Z3493 Encounter for supervision of normal pregnancy, unspecified, third trimester: Secondary | ICD-10-CM

## 2017-09-01 NOTE — Patient Instructions (Signed)

## 2017-09-01 NOTE — Progress Notes (Signed)
   PRENATAL VISIT NOTE  Subjective:  Jasmine Ritter is a 32 y.o. G3P2003 at 4071w3d being seen today for ongoing prenatal care.  She is currently monitored for the following issues for this low-risk pregnancy and has Severe major depression without psychotic features (HCC); Supervision of low-risk pregnancy; and Morning sickness on their problem list.  Patient reports no complaints.  Contractions: Not present.  .  Movement: Present. Denies leaking of fluid.   The following portions of the patient's history were reviewed and updated as appropriate: allergies, current medications, past family history, past medical history, past social history, past surgical history and problem list. Problem list updated.  Objective:   Vitals:   09/01/17 1312  BP: 115/60  Pulse: 72  Weight: 176 lb 1.6 oz (79.9 kg)    Fetal Status: Fetal Heart Rate (bpm): 132 Fundal Height: 31 cm Movement: Present  Presentation: Vertex  General:  Alert, oriented and cooperative. Patient is in no acute distress.  Skin: Skin is warm and dry. No rash noted.   Cardiovascular: Normal heart rate noted  Respiratory: Normal respiratory effort, no problems with respiration noted  Abdomen: Soft, gravid, appropriate for gestational age.  Pain/Pressure: Absent     Pelvic: Cervical exam deferred        Extremities: Normal range of motion.  Edema: Trace  Mental Status: Normal mood and affect. Normal behavior. Normal judgment and thought content.   Assessment and Plan:  Pregnancy: G3P2003 at 4271w3d  1. Encounter for supervision of low-risk pregnancy in third trimester -Patient doing well, no complaints  -Anticipatory guidance on upcoming visits   2. Hx of depression  - Denies symptoms of depression and anxiety at this time. In house behavioral health services offered during pregnancy and postpartum    Preterm labor symptoms and general obstetric precautions including but not limited to vaginal bleeding, contractions, leaking of  fluid and fetal movement were reviewed in detail with the patient. Please refer to After Visit Summary for other counseling recommendations.  Return in about 4 weeks (around 09/29/2017) for ROB.  Future Appointments  Date Time Provider Department Center  09/29/2017  1:15 PM Marvetta GibbonsBurleson, Brand Maleserri L, NP [facility]    Sharyon CableVeronica C Kawanda Drumheller, CNM

## 2017-09-29 ENCOUNTER — Ambulatory Visit (INDEPENDENT_AMBULATORY_CARE_PROVIDER_SITE_OTHER): Payer: Medicaid Other | Admitting: Nurse Practitioner

## 2017-09-29 ENCOUNTER — Encounter: Payer: Self-pay | Admitting: Nurse Practitioner

## 2017-09-29 VITALS — BP 115/64 | HR 86 | Wt 181.1 lb

## 2017-09-29 DIAGNOSIS — Z3493 Encounter for supervision of normal pregnancy, unspecified, third trimester: Secondary | ICD-10-CM

## 2017-09-29 DIAGNOSIS — O26893 Other specified pregnancy related conditions, third trimester: Secondary | ICD-10-CM

## 2017-09-29 DIAGNOSIS — M549 Dorsalgia, unspecified: Secondary | ICD-10-CM

## 2017-09-29 DIAGNOSIS — Z3490 Encounter for supervision of normal pregnancy, unspecified, unspecified trimester: Secondary | ICD-10-CM

## 2017-09-29 NOTE — Progress Notes (Signed)
    Subjective:  Jasmine Ritter is a 32 y.o. G3P2003 at 4833w3d being seen today for ongoing prenatal care.  She is currently monitored for the following issues for this low-risk pregnancy and has Severe major depression without psychotic features (HCC) and Supervision of low-risk pregnancy on their problem list.  Patient reports "debilitating back pain", difficulty walking, pain that makes her stop and bend over.  Particulary bad pain when vaccuming..  Contractions: Not present. Vag. Bleeding: None.  Movement: Present. Denies leaking of fluid.   The following portions of the patient's history were reviewed and updated as appropriate: allergies, current medications, past family history, past medical history, past social history, past surgical history and problem list. Problem list updated.  Objective:   Vitals:   09/29/17 1313  BP: 115/64  Pulse: 86  Weight: 181 lb 1.6 oz (82.1 kg)    Fetal Status: Fetal Heart Rate (bpm): 145 Fundal Height: 37 cm Movement: Present  Presentation: Vertex  General:  Alert, oriented and cooperative. Patient is in no acute distress.  Skin: Skin is warm and dry. No rash noted.   Cardiovascular: Normal heart rate noted  Respiratory: Normal respiratory effort, no problems with respiration noted  Abdomen: Soft, gravid, appropriate for gestational age. Pain/Pressure: Present     Pelvic:  Cervical exam performed Dilation: Closed Effacement (%): Thick Station: -3  Extremities: Normal range of motion.  Edema: Trace  Mental Status: Normal mood and affect. Normal behavior. Normal judgment and thought content.   Urinalysis:      Assessment and Plan:  Pregnancy: G3P2003 at 7833w3d  1. Encounter for supervision of low-risk pregnancy, antepartum Having lots of back pain and has had to stop and bend over for the pain to pass. Declines to fill out depression screening info.  2. Back pain during pregnancy in third trimester Client is very distressed by this back pain  and thinks it is not normal.  Concerned that she is not being taken seriously at this appointment today.  Discussed that this may be a usual pain in pregnancy that she has not had in a previous pregnancy (her last pregnancy was a surrogate twin gestation).  Cervical exam confirmed she is not in preterm labor. Her back pain may be associated with the confirmed pubic symphysis discomfort she is having. Advised ice to her back for 15 minutes twice a day - do not place the ice directly on the skin. Demonstrated and reviewed exercises that may relieve back pain. Client had no further questions at the end of the visit.  Voiced agreement to this plan today.  - Ambulatory referral to Physical Therapy  Preterm labor symptoms and general obstetric precautions including but not limited to vaginal bleeding, contractions, leaking of fluid and fetal movement were reviewed in detail with the patient. Please refer to After Visit Summary for other counseling recommendations.  Return in about 1 week (around 10/06/2017).  Nolene BernheimERRI Aarush Stukey, RN, MSN, NP-BC Nurse Practitioner, [facility], [facility] 10/02/2017 10:19 AM

## 2017-09-29 NOTE — Progress Notes (Signed)
Pt states has had debilitating back pain, has a maternity belt but still has the pain.

## 2017-09-29 NOTE — Patient Instructions (Signed)

## 2017-10-09 ENCOUNTER — Encounter: Payer: Self-pay | Admitting: Nurse Practitioner

## 2017-10-10 ENCOUNTER — Other Ambulatory Visit (HOSPITAL_COMMUNITY)
Admission: RE | Admit: 2017-10-10 | Discharge: 2017-10-10 | Disposition: A | Discharge status: Home / Self Care | Payer: Medicaid Other | Source: Ambulatory Visit | Attending: Student | Admitting: Student

## 2017-10-10 ENCOUNTER — Encounter: Payer: Self-pay | Admitting: Student

## 2017-10-10 ENCOUNTER — Ambulatory Visit (INDEPENDENT_AMBULATORY_CARE_PROVIDER_SITE_OTHER): Payer: Medicaid Other | Admitting: Student

## 2017-10-10 DIAGNOSIS — Z3490 Encounter for supervision of normal pregnancy, unspecified, unspecified trimester: Secondary | ICD-10-CM | POA: Diagnosis present

## 2017-10-10 DIAGNOSIS — Z3493 Encounter for supervision of normal pregnancy, unspecified, third trimester: Secondary | ICD-10-CM | POA: Insufficient documentation

## 2017-10-10 NOTE — Patient Instructions (Addendum)
Places to have your son circumcised:    [facility] [phone] $[phone] while you are in hospital  [facility] [phone] $[phone] by 4 wks  [facility] [phone] $[phone] by 2 wks  [facility] [phone] $[phone] by 7 days [facility] [phone] $[phone] by 4 wks  These prices sometimes change but are roughly what you can expect to pay. Please call and confirm pricing.   Circumcision is considered an elective/non-medically necessary procedure. There are many reasons parents decide to have their sons circumsized. During the first year of life circumcised males have a reduced risk of urinary tract infections but after this year the rates between circumcised males and uncircumcised males are the same.  It is safe to have your son circumcised outside of the hospital and the places above perform them regularly.   Deciding about Circumcision in Baby Boys  (Up-to-date The Basics)  What is circumcision?  Circumcision is a surgery that removes the skin that covers the tip of the penis, called the "foreskin" Circumcision is usually done when a boy is between 1 and 10 days old. In the [place], circumcision is common. In some other countries, fewer boys are circumcised. Circumcision is a common tradition in some religions.  Should I have my baby boy circumcised?  There is no easy answer. Circumcision has some benefits. But it also has risks. After talking with your doctor, you will have to decide for yourself what is right for your family.  What are the benefits of circumcision?  Circumcised boys seem to have slightly lower rates of: ?Urinary tract infections ?Swelling of the opening at the tip of the penis Circumcised men seem to have slightly lower rates of: ?Urinary tract infections ?Swelling of the opening at the tip of the penis ?Penis  cancer ?HIV and other infections that you catch during sex ?Cervical cancer in the women they have sex with Even so, in the [place], the risks of these problems are small - even in boys and men who have not been circumcised. Plus, boys and men who are not circumcised can reduce these extra risks by: ?Cleaning their penis well ?Using condoms during sex  What are the risks of circumcision?  Risks include: ?Bleeding or infection from the surgery ?Damage to or amputation of the penis ?A chance that the doctor will cut off too much or not enough of the foreskin ?A chance that sex won't feel as good later in life Only about 1 out of every 200 circumcisions leads to problems. There is also a chance that your health insurance won't pay for circumcision.  How is circumcision done in baby boys?  First, the baby gets medicine for pain relief. This might be a cream on the skin or a shot into the base of the penis. Next, the doctor cleans the baby's penis well. Then he or she uses special tools to cut off the foreskin. Finally, the doctor wraps a bandage (called gauze) around the baby's penis. If you have your baby circumcised, his doctor or nurse will give you instructions on how to care for him after the surgery. It is important that you follow those instructions carefully. AREA PEDIATRIC/FAMILY PRACTICE PHYSICIANS  [facility] [address], [address] [place], [place]  [address] Phone - [phone]   Fax - [phone]  [facility] [address] [place], [place] [address] Phone - [phone]   Fax - [phone]  [facility] [address] [place], [place]    [address] Phone - [phone]   Fax - [phone]  [facility] [address] [place], [place]  [address] Phone - [phone]   Fax - [phone]  [facility] [address] [place], [place]  [address] Phone - [phone]   Fax -  [phone]  [facility] [address], [address] [place], [place]  [address] Phone - [phone]   Fax - [phone]  [facility] [address], [address] [place], [place]  [address] Phone - [phone]   Fax - [phone]  [facility] [address], [address] [place], [place]  [address] Phone - [phone]   Fax - [phone]  [facility] [address] [place], [place]  [address] Phone - [phone]   Fax - [phone] [facility] [address] [place], [place]  [address] Phone - [phone]   Fax - [phone]  [facility] [address] [place], [place]  [address] Phone - [phone]   Fax - [phone]  [facility] [address] [place], [place]  [address] Phone - [phone]   Fax - [phone]  [facility] [address], [address] [place], [place]  [address] Phone - [phone]   Fax - [phone]  [facility] GOSR[facility] [address] [place], [place]  [address] Phone - [phone]  [facility] [address] [place], [place]  [address] Phone - [phone]   Fax - [phone]  [facility] [address] [place], [place]  [address] Phone - [phone]   Fax - [phone]  [facility] [address] [place], [place]  [address] Phone - [phone]   Fax - [phone]  [facility] [address] [place], [place]  [address] Phone - [phone]   Fax - [phone]   [facility] [address] [place], [place]  [address] Phone - [phone]   Fax - [phone]  [facility] [address] [place], [place]  [address] Phone - [phone]   Fax - [phone]  [facility] [address], [address] [place], [place]  [address] Phone - [phone]   Fax - [phone]  [facility] [address] [place], [place]  [address] Phone - [phone]   Fax - [phone]  [facility] [address] , [place]  [address] Phone - [phone]   Fax - [phone] [facility] [address] [place], [place]  [address] Phone - [phone]   Fax - [phone]  [facility] [address] [place], [place]  [address] Phone - [phone]   Fax - [phone]  [facility] [address] [place], [place]  [address] Phone - [phone]   Fax - [phone]  [facility] Charlene Flemming MD [address] [place]  [address] Phone [phone]  Fax [phone]   

## 2017-10-10 NOTE — Progress Notes (Signed)
   PRENATAL VISIT NOTE  Subjective:  Jasmine Ritter is a 32 y.o. G3P2003 at 8660w0d being seen today for ongoing prenatal care.  She is currently monitored for the following issues for this low-risk pregnancy and has Severe major depression without psychotic features (HCC) and Supervision of low-risk pregnancy on their problem list.  Patient reports no complaints.  Contractions: Not present. Vag. Bleeding: None.  Movement: Present. Denies leaking of fluid.   The following portions of the patient's history were reviewed and updated as appropriate: allergies, current medications, past family history, past medical history, past social history, past surgical history and problem list. Problem list updated.  Objective:   Vitals:   10/10/17 1413  BP: 115/65  Pulse: 85  Weight: 182 lb (82.6 kg)    Fetal Status: Fetal Heart Rate (bpm): 136 Fundal Height: 37 cm Movement: Present  Presentation: Vertex  General:  Alert, oriented and cooperative. Patient is in no acute distress.  Skin: Skin is warm and dry. No rash noted.   Cardiovascular: Normal heart rate noted  Respiratory: Normal respiratory effort, no problems with respiration noted  Abdomen: Soft, gravid, appropriate for gestational age.  Pain/Pressure: Present     Pelvic: Cervical exam performed      0, vertex  Extremities: Normal range of motion.  Edema: Trace  Mental Status: Normal mood and affect. Normal behavior. Normal judgment and thought content.   Assessment and Plan:  Pregnancy: G3P2003 at 5660w0d  1. Encounter for supervision of low-risk pregnancy, antepartum -doing well; list of circ locations given - Culture, beta strep (group b only) - GC/Chlamydia probe amp ([place]  Term labor symptoms and general obstetric precautions including but not limited to vaginal bleeding, contractions, leaking of fluid and fetal movement were reviewed in detail with the patient. Please refer to After Visit Summary for other  counseling recommendations.  No follow-ups on file.  Future Appointments  Date Time Provider Department Center  10/17/2017  2:15 PM Leftwich-Kirby, Windy FastLisa A, CNM [facility]  10/24/2017  1:55 PM Crisoforo [place], Charlesetta GaribaldiKathryn Lorraine, CNM [facility]    Charlesetta GaribaldiKathryn Lorraine [place], [place]

## 2017-10-11 LAB — GC/CHLAMYDIA PROBE AMP (~~LOC~~) NOT AT ARMC
Chlamydia: NEGATIVE
Neisseria Gonorrhea: NEGATIVE

## 2017-10-13 LAB — CULTURE, BETA STREP (GROUP B ONLY): Strep Gp B Culture: NEGATIVE

## 2017-10-17 ENCOUNTER — Ambulatory Visit (INDEPENDENT_AMBULATORY_CARE_PROVIDER_SITE_OTHER): Payer: Medicaid Other | Admitting: Advanced Practice Midwife

## 2017-10-17 VITALS — BP 116/67 | HR 86 | Wt 184.7 lb

## 2017-10-17 DIAGNOSIS — O99891 Other specified diseases and conditions complicating pregnancy: Secondary | ICD-10-CM

## 2017-10-17 DIAGNOSIS — O9989 Other specified diseases and conditions complicating pregnancy, childbirth and the puerperium: Secondary | ICD-10-CM

## 2017-10-17 DIAGNOSIS — M549 Dorsalgia, unspecified: Secondary | ICD-10-CM

## 2017-10-17 LAB — POCT URINALYSIS DIP (DEVICE)
Bilirubin Urine: NEGATIVE
Glucose, UA: NEGATIVE mg/dL
Hgb urine dipstick: NEGATIVE
Ketones, ur: NEGATIVE mg/dL
NITRITE: NEGATIVE
PH: 6.5 (ref 5.0–8.0)
PROTEIN: 30 mg/dL — AB
Specific Gravity, Urine: >=1.03 (ref 1.005–1.030)
Urobilinogen, UA: 1 mg/dL (ref 0.0–1.0)

## 2017-10-17 MED ORDER — CYCLOBENZAPRINE HCL 10 MG PO TABS: 5.0000 mg | ORAL_TABLET | Freq: Three times a day (TID) | ORAL | 0 refills | Status: DC | PRN

## 2017-10-17 NOTE — Progress Notes (Signed)
   PRENATAL VISIT NOTE  Subjective:  Jasmine Ritter is a 32 y.o. G3P2003 at [redacted]w[redacted]d being seen today for ongoing prenatal care.  She is currently monitored for the following issues for this low-risk pregnancy and has Severe major depression without psychotic features (HCC) and Supervision of low-risk pregnancy on their problem list.  Patient reports backache, fatigue and heartburn. Patient reports the back pain is constant but is worse in the evening and is localized to the right buttock. The pain does not radiate and patient denies numbess and tingling of the legs. She reports the pain is exacerbated with bending over. Contractions: Not present. Vag. Bleeding: None.  Movement: Present. Denies leaking of fluid.   The following portions of the patient's history were reviewed and updated as appropriate: allergies, current medications, past family history, past medical history, past social history, past surgical history and problem list. Problem list updated.  Objective:   Vitals:   10/17/17 1423  BP: 116/67  Pulse: 86  Weight: 184 lb 11.2 oz (83.8 kg)    Fetal Status: Fetal Heart Rate (bpm): 147   Movement: Present     General:  Alert, oriented and cooperative. Patient is in no acute distress.  Skin: Skin is warm and dry. No rash noted.   Cardiovascular: Normal heart rate noted  Respiratory: Normal respiratory effort, no problems with respiration noted  Abdomen: Soft, gravid, appropriate for gestational age.  Pain/Pressure: Present     Pelvic: Cervical exam deferred        Extremities: Normal range of motion.  Edema: None  Mental Status: Normal mood and affect. Normal behavior. Normal judgment and thought content.   Assessment and Plan:  Pregnancy: G3P2003 at [redacted]w[redacted]d 1. Back pain affecting pregnancy in third trimester - Likely MSK due to pain description.   -Culture, OB Urine -Discussed use of ice, heat, Tylenol and warm baths for back pain -Patient was prescribed Flexeril 10mg  PRN for  low back pain. She was instructed to avoid taking them before driving until she knows how they affect her. Patient conveyed understanding.  2. Encounter for supervision of low risk pregnancy, antepartum -anticipatory guidance for next visit   Term labor symptoms and general obstetric precautions including but not limited to vaginal bleeding, contractions, leaking of fluid and fetal movement were reviewed in detail with the patient. Please refer to After Visit Summary for other counseling recommendations.  No follow-ups on file.  Future Appointments  Date Time Provider Department Center  10/24/2017  1:55 PM Marylene Land, CNM [facility]    [place], [place]

## 2017-10-17 NOTE — Progress Notes (Signed)
   PRENATAL VISIT NOTE  Subjective:  Jasmine Ritter is a 32 y.o. G3P2003 at [redacted]w[redacted]d being seen today for ongoing prenatal care.  She is currently monitored for the following issues for this low-risk pregnancy and has Severe major depression without psychotic features (HCC) and Supervision of low-risk pregnancy on their problem list.  Patient reports backache.  Contractions: Not present. Vag. Bleeding: None.  Movement: Present. Denies leaking of fluid.   The following portions of the patient's history were reviewed and updated as appropriate: allergies, current medications, past family history, past medical history, past social history, past surgical history and problem list. Problem list updated.  Objective:   Vitals:   10/17/17 1423  BP: 116/67  Pulse: 86  Weight: 83.8 kg    Fetal Status: Fetal Heart Rate (bpm): 147 Fundal Height: 38 cm Movement: Present     General:  Alert, oriented and cooperative. Patient is in no acute distress.  Skin: Skin is warm and dry. No rash noted.   Cardiovascular: Normal heart rate noted  Respiratory: Normal respiratory effort, no problems with respiration noted  Abdomen: Soft, gravid, appropriate for gestational age.  Pain/Pressure: Present     Pelvic: Cervical exam deferred        Extremities: Normal range of motion.  Edema: None  Mental Status: Normal mood and affect. Normal behavior. Normal judgment and thought content.   Assessment and Plan:  Pregnancy: G3P2003 at [redacted]w[redacted]d  1. Back pain affecting pregnancy in third trimester --Pain is mostly right side, worse with movement. Sometimes can be severe and shoot up into her mid back.  Likely musculoskeletal but will check urine today.  --rest/ice/heat/warm bath/Tylenol - cyclobenzaprine (FLEXERIL) 10 MG tablet; Take 0.5-1 tablets (5-10 mg total) by mouth 3 (three) times daily as needed for muscle spasms.  Dispense: 20 tablet; Refill: 0 - Culture, OB Urine - POCT urinalysis dip (device)  Term labor  symptoms and general obstetric precautions including but not limited to vaginal bleeding, contractions, leaking of fluid and fetal movement were reviewed in detail with the patient. Please refer to After Visit Summary for other counseling recommendations.  No follow-ups on file.  Future Appointments  Date Time Provider Department Center  10/24/2017  1:55 PM Marylene Land, CNM [facility]    Sharen Counter, CNM

## 2017-10-18 ENCOUNTER — Encounter: Payer: Self-pay | Admitting: Nurse Practitioner

## 2017-10-19 LAB — CULTURE, OB URINE

## 2017-10-19 LAB — URINE CULTURE, OB REFLEX

## 2017-10-21 ENCOUNTER — Encounter (HOSPITAL_COMMUNITY): Payer: Self-pay | Admitting: *Deleted

## 2017-10-21 ENCOUNTER — Inpatient Hospital Stay (HOSPITAL_COMMUNITY)
Admission: AD | Admit: 2017-10-21 | Discharge: 2017-10-21 | Disposition: A | Discharge status: Home / Self Care | Payer: Medicaid Other | Source: Ambulatory Visit | Attending: Internal Medicine | Admitting: Internal Medicine

## 2017-10-21 DIAGNOSIS — M25552 Pain in left hip: Secondary | ICD-10-CM | POA: Insufficient documentation

## 2017-10-21 DIAGNOSIS — O368130 Decreased fetal movements, third trimester, not applicable or unspecified: Secondary | ICD-10-CM | POA: Diagnosis present

## 2017-10-21 DIAGNOSIS — R109 Unspecified abdominal pain: Secondary | ICD-10-CM | POA: Insufficient documentation

## 2017-10-21 DIAGNOSIS — Z79899 Other long term (current) drug therapy: Secondary | ICD-10-CM | POA: Insufficient documentation

## 2017-10-21 DIAGNOSIS — Z3A38 38 weeks gestation of pregnancy: Secondary | ICD-10-CM | POA: Diagnosis not present

## 2017-10-21 DIAGNOSIS — O26893 Other specified pregnancy related conditions, third trimester: Secondary | ICD-10-CM | POA: Insufficient documentation

## 2017-10-21 DIAGNOSIS — Z3689 Encounter for other specified antenatal screening: Secondary | ICD-10-CM | POA: Diagnosis not present

## 2017-10-21 DIAGNOSIS — M25551 Pain in right hip: Secondary | ICD-10-CM | POA: Diagnosis not present

## 2017-10-21 LAB — URINALYSIS, ROUTINE W REFLEX MICROSCOPIC
BILIRUBIN URINE: NEGATIVE
GLUCOSE, UA: NEGATIVE mg/dL
HGB URINE DIPSTICK: NEGATIVE
KETONES UR: NEGATIVE mg/dL
NITRITE: NEGATIVE
PROTEIN: NEGATIVE mg/dL
Specific Gravity, Urine: 1.006 (ref 1.005–1.030)
pH: 7 (ref 5.0–8.0)

## 2017-10-21 NOTE — MAU Note (Signed)
Jasmine Ritter is a 32 y.o. at [redacted]w[redacted]d here in MAU reporting: +decreased fetal movement; some movement reported but less than normal +vaginal pressure +bilateral hip pain that is sharp and cramping at times Onset of complaint: started this morning Pain score: 7/10 Denies LOF or VB Vitals:   10/21/17 1235  BP: 123/76  Pulse: 70  Resp: 18  Temp: 97.7 F (36.5 C)  SpO2: 100%     FHT:153 via external monitor Lab orders placed from triage: ua

## 2017-10-21 NOTE — MAU Provider Note (Signed)
History     CSN: [id number]  Arrival date and time: 10/21/17 1222   First Provider Initiated Contact with Patient 10/21/17 1300      Chief Complaint  Patient presents with  . Decreased Fetal Movement  . vaginal pressure   HPI  Ms.  Jasmine Ritter is a 32 y.o. year old G53P2003 female at [redacted]w[redacted]d weeks gestation who presents to MAU reporting vaginal pressure, bilateral hip pain, abdominal cramping, and DFM since today. She denies VB or LOF. She reports that fetal movement is normally very tive with sitting or in resting positions, but not since last night. She states that "he has readjusted his body, but no kicks or punches."   Past Medical History:  Diagnosis Date  . ADHD     Past Surgical History:  Procedure Laterality Date  . NO PAST SURGERIES      Family History  Problem Relation Age of Onset  . Cancer Mother   . Diabetes Paternal Grandfather     Social History   Tobacco Use  . Smoking status: Never Smoker  . Smokeless tobacco: Never Used  Substance Use Topics  . Alcohol use: Not Currently  . Drug use: No    Allergies: No Known Allergies  Medications Prior to Admission  Medication Sig Dispense Refill Last Dose  . cyclobenzaprine (FLEXERIL) 10 MG tablet Take 0.5-1 tablets (5-10 mg total) by mouth 3 (three) times daily as needed for muscle spasms. 20 tablet 0   . Prenatal Vit-Fe Fumarate-FA (MULTIVITAMIN-PRENATAL) 27-0.8 MG TABS tablet Take 1 tablet by mouth daily at 12 noon.   Taking    Review of Systems  Constitutional: Negative.   HENT: Negative.   Eyes: Negative.   Respiratory: Negative.   Cardiovascular: Negative.   Gastrointestinal: Negative.   Endocrine: Negative.   Genitourinary: Positive for pelvic pain (cramping).  Musculoskeletal: Positive for arthralgias (hip joint pain).  Skin: Negative.   Allergic/Immunologic: Negative.   Neurological: Negative.   Hematological: Negative.   Psychiatric/Behavioral: Negative.    Physical Exam   Blood  pressure 123/76, pulse 70, temperature 97.7 F (36.5 C), temperature source Oral, resp. rate 18, weight 86.7 kg, last menstrual period 01/24/2017, SpO2 100 %.  Physical Exam  Nursing note and vitals reviewed. Constitutional: She is oriented to person, place, and time. She appears well-developed and well-nourished.  HENT:  Head: Normocephalic and atraumatic.  Eyes: Pupils are equal, round, and reactive to light.  Neck: Normal range of motion.  Cardiovascular: Normal rate and regular rhythm.  Respiratory: Effort normal.  GI: Soft.  Genitourinary:  Genitourinary Comments: Dilation: 1 Effacement (%): 50 Cervical Position: Middle Station: Ballotable Presentation: Vertex Exam by: Carloyn Jaeger, CNM  Musculoskeletal: Normal range of motion.  Neurological: She is alert and oriented to person, place, and time.  Skin: Skin is warm and dry.  Psychiatric: She has a normal mood and affect. Her behavior is normal. Judgment and thought content normal.    MAU Course  Procedures  MDM CCUA NST - FHR: 140 bpm / moderate variability / accels present / decels absent / TOCO: none  Results for orders placed or performed during the hospital encounter of 10/21/17 (from the past 24 hour(s))  Urinalysis, Routine w reflex microscopic     Status: Abnormal   Collection Time: 10/21/17 12:38 PM  Result Value Ref Range   Color, Urine STRAW (A) YELLOW   APPearance CLEAR CLEAR   Specific Gravity, Urine 1.006 1.005 - 1.030   pH 7.0 5.0 - 8.0  Glucose, UA NEGATIVE NEGATIVE mg/dL   Hgb urine dipstick NEGATIVE NEGATIVE   Bilirubin Urine NEGATIVE NEGATIVE   Ketones, ur NEGATIVE NEGATIVE mg/dL   Protein, ur NEGATIVE NEGATIVE mg/dL   Nitrite NEGATIVE NEGATIVE   Leukocytes, UA TRACE (A) NEGATIVE   RBC / HPF 0-5 0 - 5 RBC/hpf   WBC, UA 0-5 0 - 5 WBC/hpf   Bacteria, UA RARE (A) NONE SEEN   Squamous Epithelial / LPF 6-10 0 - 5   Mucus PRESENT     Assessment and Plan  NST (non-stress test) reactive - Plan:  Discharge patient - Reassurance given that fetal well-being is normal at this time - [facility] information given - Keep scheduled appt with [facility] on 10/24/2017 - Patient verbalized an understanding of the plan of care and agrees.   Raelyn Mora, MSN, CNM 10/21/2017, 1:00 PM

## 2017-10-24 ENCOUNTER — Encounter (HOSPITAL_COMMUNITY): Payer: Self-pay | Admitting: *Deleted

## 2017-10-24 ENCOUNTER — Other Ambulatory Visit: Payer: Self-pay | Admitting: Student

## 2017-10-24 ENCOUNTER — Ambulatory Visit (INDEPENDENT_AMBULATORY_CARE_PROVIDER_SITE_OTHER): Payer: Medicaid Other | Admitting: Student

## 2017-10-24 ENCOUNTER — Telehealth (HOSPITAL_COMMUNITY): Payer: Self-pay | Admitting: *Deleted

## 2017-10-24 DIAGNOSIS — Z3493 Encounter for supervision of normal pregnancy, unspecified, third trimester: Secondary | ICD-10-CM

## 2017-10-24 NOTE — Progress Notes (Unsigned)
Induction orders placed

## 2017-10-24 NOTE — Telephone Encounter (Signed)
Preadmission screen  

## 2017-10-24 NOTE — Progress Notes (Signed)
   PRENATAL VISIT NOTE  Subjective:  Jasmine Ritter is a 32 y.o. G3P2003 at [redacted]w[redacted]d being seen today for ongoing prenatal care.  She is currently monitored for the following issues for this low-risk pregnancy and has Severe major depression without psychotic features (HCC); Supervision of low-risk pregnancy; and NST (non-stress test) reactive on their problem list.  Patient reports no complaints.  Contractions: Not present. Vag. Bleeding: None.  Movement: Present. Denies leaking of fluid.   The following portions of the patient's history were reviewed and updated as appropriate: allergies, current medications, past family history, past medical history, past social history, past surgical history and problem list. Problem list updated.  Objective:   Vitals:   10/24/17 1401  BP: 121/70  Pulse: 82  Weight: 190 lb 6.4 oz (86.4 kg)    Fetal Status: Fetal Heart Rate (bpm): 145 Fundal Height: 38 cm Movement: Present     General:  Alert, oriented and cooperative. Patient is in no acute distress.  Skin: Skin is warm and dry. No rash noted.   Cardiovascular: Normal heart rate noted  Respiratory: Normal respiratory effort, no problems with respiration noted  Abdomen: Soft, gravid, appropriate for gestational age.  Pain/Pressure: Present     Pelvic: Cervical exam deferred        Extremities: Normal range of motion.  Edema: None  Mental Status: Normal mood and affect. Normal behavior. Normal judgment and thought content.   Assessment and Plan:  Pregnancy: G3P2003 at [redacted]w[redacted]d  1. Encounter for supervision of low-risk pregnancy in third trimester -return next week for LROB -Discussed Nexplanon -Will bring peds list next week.   Term labor symptoms and general obstetric precautions including but not limited to vaginal bleeding, contractions, leaking of fluid and fetal movement were reviewed in detail with the patient. Please refer to After Visit Summary for other counseling recommendations.    Return in about 1 week (around 10/31/2017).  Future Appointments  Date Time Provider Department Center  11/07/2017  7:30 AM [facility] ROOM [facility] None    Charlesetta Garibaldi [place], [place]

## 2017-10-24 NOTE — Patient Instructions (Signed)
Labor Induction Labor induction is when steps are taken to cause a pregnant woman to begin the labor process. Most women go into labor on their own between 37 weeks and 42 weeks of the pregnancy. When this does not happen or when there is a medical need, methods may be used to induce labor. Labor induction causes a pregnant woman's uterus to contract. It also causes the cervix to soften (ripen), open (dilate), and thin out (efface). Usually, labor is not induced before 39 weeks of the pregnancy unless there is a problem with the baby or mother. Before inducing labor, your health care provider will consider a number of factors, including the following:  The medical condition of you and the baby.  How many weeks along you are.  The status of the baby's lung maturity.  The condition of the cervix.  The position of the baby. What are the reasons for labor induction? Labor may be induced for the following reasons:  The health of the baby or mother is at risk.  The pregnancy is overdue by 1 week or more.  The water breaks but labor does not start on its own.  The mother has a health condition or serious illness, such as high blood pressure, infection, placental abruption, or diabetes.  The amniotic fluid amounts are low around the baby.  The baby is distressed. Convenience or wanting the baby to be born on a certain date is not a reason for inducing labor. What methods are used for labor induction? Several methods of labor induction may be used, such as:  Prostaglandin medicine. This medicine causes the cervix to dilate and ripen. The medicine will also start contractions. It can be taken by mouth or by inserting a suppository into the vagina.  Inserting a thin tube (catheter) with a balloon on the end into the vagina to dilate the cervix. Once inserted, the balloon is expanded with water, which causes the cervix to open.  Stripping the membranes. Your health care provider separates  amniotic sac tissue from the cervix, causing the cervix to be stretched and causing the release of a hormone called progesterone. This may cause the uterus to contract. It is often done during an office visit. You will be sent home to wait for the contractions to begin. You will then come in for an induction.  Breaking the water. Your health care provider makes a hole in the amniotic sac using a small instrument. Once the amniotic sac breaks, contractions should begin. This may still take hours to see an effect.  Medicine to trigger or strengthen contractions. This medicine is given through an IV access tube inserted into a vein in your arm. All of the methods of induction, besides stripping the membranes, will be done in the hospital. Induction is done in the hospital so that you and the baby can be carefully monitored. How long does it take for labor to be induced? Some inductions can take up to 2-3 days. Depending on the cervix, it usually takes less time. It takes longer when you are induced early in the pregnancy or if this is your first pregnancy. If a mother is still pregnant and the induction has been going on for 2-3 days, either the mother will be sent home or a cesarean delivery will be needed. What are the risks associated with labor induction? Some of the risks of induction include:  Changes in fetal heart rate, such as too high, too low, or erratic.  Fetal distress.    Chance of infection for the mother and baby.  Increased chance of having a cesarean delivery.  Breaking off (abruption) of the placenta from the uterus (rare).  Uterine rupture (very rare). When induction is needed for medical reasons, the benefits of induction may outweigh the risks. What are some reasons for not inducing labor? Labor induction should not be done if:  It is shown that your baby does not tolerate labor.  You have had previous surgeries on your uterus, such as a myomectomy or the removal of  fibroids.  Your placenta lies very low in the uterus and blocks the opening of the cervix (placenta previa).  Your baby is not in a head-down position.  The umbilical cord drops down into the birth canal in front of the baby. This could cut off the baby's blood and oxygen supply.  You have had a previous cesarean delivery.  There are unusual circumstances, such as the baby being extremely premature. This information is not intended to replace advice given to you by your health care provider. Make sure you discuss any questions you have with your health care provider. Document Released: 05/25/2006 Document Revised: 06/11/2015 Document Reviewed: 08/02/2012 Elsevier Interactive Patient Education  2017 Elsevier Inc.  

## 2017-10-25 ENCOUNTER — Encounter: Payer: Self-pay | Admitting: Advanced Practice Midwife

## 2017-11-01 ENCOUNTER — Encounter: Payer: Self-pay | Admitting: Nurse Practitioner

## 2017-11-02 ENCOUNTER — Other Ambulatory Visit: Payer: Self-pay | Admitting: Student

## 2017-11-02 ENCOUNTER — Ambulatory Visit: Payer: Self-pay

## 2017-11-02 ENCOUNTER — Ambulatory Visit (INDEPENDENT_AMBULATORY_CARE_PROVIDER_SITE_OTHER): Payer: Medicaid Other | Admitting: Obstetrics and Gynecology

## 2017-11-02 ENCOUNTER — Ambulatory Visit (INDEPENDENT_AMBULATORY_CARE_PROVIDER_SITE_OTHER): Payer: Medicaid Other | Admitting: *Deleted

## 2017-11-02 VITALS — BP 114/68 | HR 103 | Wt 189.6 lb

## 2017-11-02 DIAGNOSIS — O480 Post-term pregnancy: Secondary | ICD-10-CM

## 2017-11-02 DIAGNOSIS — Z3493 Encounter for supervision of normal pregnancy, unspecified, third trimester: Secondary | ICD-10-CM

## 2017-11-02 NOTE — Progress Notes (Signed)
IOL scheduled on 10/22.

## 2017-11-02 NOTE — Progress Notes (Signed)
   PRENATAL VISIT NOTE  Subjective:  Jasmine Ritter is a 32 y.o. G3P2003 at [redacted]w[redacted]d being seen today for ongoing prenatal care.  She is currently monitored for the following issues for this low-risk pregnancy and has Severe major depression without psychotic features (HCC); Supervision of low-risk pregnancy; and NST (non-stress test) reactive on their problem list.  Patient reports no complaints.  Contractions: Not present. Vag. Bleeding: None.  Movement: Present. Denies leaking of fluid.   The following portions of the patient's history were reviewed and updated as appropriate: allergies, current medications, past family history, past medical history, past social history, past surgical history and problem list. Problem list updated.  Objective:   Vitals:   11/02/17 0912  BP: 114/68  Pulse: (!) 103  Weight: 189 lb 9.6 oz (86 kg)    Fetal Status: Fetal Heart Rate (bpm): NST   Movement: Present     General:  Alert, oriented and cooperative. Patient is in no acute distress.  Skin: Skin is warm and dry. No rash noted.   Cardiovascular: Normal heart rate noted  Respiratory: Normal respiratory effort, no problems with respiration noted  Abdomen: Soft, gravid, appropriate for gestational age.  Pain/Pressure: Present     Pelvic: Cervical exam deferred        Extremities: Normal range of motion.     Mental Status: Normal mood and affect. Normal behavior. Normal judgment and thought content.   Assessment and Plan:  Pregnancy: G3P2003 at [redacted]w[redacted]d  1. Encounter for supervision of low-risk pregnancy in third trimester  GBS negative  BPP reactive Baseline 140's, 15x15 accels, no decels BPP 10/10  2. Post term pregnancy, antepartum condition or complication  Induction Scheduled for 10/22: orders entered, patient states she will not show up for her induction on Tuesday. We called to re-schedule the patient for Saturday morning and again the patient says she does not know if that time and day  will work. I discussed the risks of Post term pregnancy including fetal demise.  Term labor symptoms and general obstetric precautions including but not limited to vaginal bleeding, contractions, leaking of fluid and fetal movement were reviewed in detail with the patient. Please refer to After Visit Summary for other counseling recommendations.  No follow-ups on file.  Future Appointments  Date Time Provider Department Center  11/04/2017  8:00 AM [facility] RO[facility] None    Venia Carbon, NP

## 2017-11-02 NOTE — Progress Notes (Signed)

## 2017-11-03 NOTE — Progress Notes (Signed)
NST:  Baseline: 145 bpm, Variability: Good {> 6 bpm), Accelerations: Reactive and Decelerations: Absent  Patient seen and assessed by nursing staff.  Agree with documentation and plan.

## 2017-11-04 ENCOUNTER — Inpatient Hospital Stay (HOSPITAL_COMMUNITY): Payer: Medicaid Other | Admitting: Anesthesiology

## 2017-11-04 ENCOUNTER — Encounter (HOSPITAL_COMMUNITY): Payer: Self-pay

## 2017-11-04 ENCOUNTER — Inpatient Hospital Stay (HOSPITAL_COMMUNITY)
Admission: RE | Admit: 2017-11-04 | Discharge: 2017-11-06 | DRG: 807 | Disposition: A | Discharge status: Home / Self Care | Payer: Medicaid Other | Attending: Obstetrics and Gynecology | Admitting: Obstetrics and Gynecology

## 2017-11-04 DIAGNOSIS — Z3493 Encounter for supervision of normal pregnancy, unspecified, third trimester: Secondary | ICD-10-CM

## 2017-11-04 DIAGNOSIS — O480 Post-term pregnancy: Principal | ICD-10-CM | POA: Diagnosis present

## 2017-11-04 DIAGNOSIS — Z3A40 40 weeks gestation of pregnancy: Secondary | ICD-10-CM

## 2017-11-04 DIAGNOSIS — Z3689 Encounter for other specified antenatal screening: Secondary | ICD-10-CM

## 2017-11-04 LAB — CBC
HCT: 33.1 % — ABNORMAL LOW (ref 36.0–46.0)
Hemoglobin: 10.7 g/dL — ABNORMAL LOW (ref 12.0–15.0)
MCH: 28.8 pg (ref 26.0–34.0)
MCHC: 32.3 g/dL (ref 30.0–36.0)
MCV: 89 fL (ref 80.0–100.0)
NRBC: 0 % (ref 0.0–0.2)
Platelets: 129 10*3/uL — ABNORMAL LOW (ref 150–400)
RBC: 3.72 MIL/uL — ABNORMAL LOW (ref 3.87–5.11)
RDW: 15.6 % — AB (ref 11.5–15.5)
WBC: 5.9 10*3/uL (ref 4.0–10.5)

## 2017-11-04 LAB — TYPE AND SCREEN
ABO/RH(D): A POS
ANTIBODY SCREEN: NEGATIVE

## 2017-11-04 LAB — ABO/RH: ABO/RH(D): A POS

## 2017-11-04 MED ORDER — IBUPROFEN 600 MG PO TABS
600.0000 mg | ORAL_TABLET | Freq: Four times a day (QID) | ORAL | Status: DC
  Administered 2017-11-04 – 2017-11-06 (×6): 600 mg via ORAL
  Filled 2017-11-04 (×5): qty 1

## 2017-11-04 MED ORDER — ONDANSETRON HCL 4 MG/2ML IJ SOLN: 4.0000 mg | INTRAMUSCULAR | Status: DC | PRN

## 2017-11-04 MED ORDER — PHENYLEPHRINE 40 MCG/ML (10ML) SYRINGE FOR IV PUSH (FOR BLOOD PRESSURE SUPPORT): 80.0000 ug | PREFILLED_SYRINGE | INTRAVENOUS | Status: DC | PRN

## 2017-11-04 MED ORDER — DIPHENHYDRAMINE HCL 50 MG/ML IJ SOLN: 12.5000 mg | INTRAMUSCULAR | Status: DC | PRN

## 2017-11-04 MED ORDER — EPHEDRINE 5 MG/ML INJ
10.0000 mg | INTRAVENOUS | Status: DC | PRN
  Filled 2017-11-04: qty 2

## 2017-11-04 MED ORDER — FENTANYL CITRATE (PF) 100 MCG/2ML IJ SOLN
100.0000 ug | INTRAMUSCULAR | Status: DC | PRN
  Administered 2017-11-04 (×2): 100 ug via INTRAVENOUS
  Filled 2017-11-04 (×2): qty 2

## 2017-11-04 MED ORDER — LIDOCAINE HCL (PF) 1 % IJ SOLN
30.0000 mL | INTRAMUSCULAR | Status: DC | PRN
  Administered 2017-11-04: 30 mL via SUBCUTANEOUS
  Filled 2017-11-04: qty 30

## 2017-11-04 MED ORDER — LACTATED RINGERS IV SOLN
INTRAVENOUS | Status: DC
  Administered 2017-11-04 (×2): via INTRAVENOUS

## 2017-11-04 MED ORDER — OXYCODONE-ACETAMINOPHEN 5-325 MG PO TABS: 1.0000 | ORAL_TABLET | ORAL | Status: DC | PRN

## 2017-11-04 MED ORDER — SIMETHICONE 80 MG PO CHEW: 80.0000 mg | CHEWABLE_TABLET | ORAL | Status: DC | PRN

## 2017-11-04 MED ORDER — EPHEDRINE 5 MG/ML INJ: 10.0000 mg | INTRAVENOUS | Status: DC | PRN

## 2017-11-04 MED ORDER — DIPHENHYDRAMINE HCL 25 MG PO CAPS: 25.0000 mg | ORAL_CAPSULE | Freq: Four times a day (QID) | ORAL | Status: DC | PRN

## 2017-11-04 MED ORDER — OXYTOCIN BOLUS FROM INFUSION
500.0000 mL | Freq: Once | INTRAVENOUS | Status: AC
  Administered 2017-11-04: 500 mL via INTRAVENOUS

## 2017-11-04 MED ORDER — LACTATED RINGERS IV SOLN: 500.0000 mL | INTRAVENOUS | Status: DC | PRN

## 2017-11-04 MED ORDER — OXYTOCIN 40 UNITS IN LACTATED RINGERS INFUSION - SIMPLE MED
2.5000 [IU]/h | INTRAVENOUS | Status: DC
  Filled 2017-11-04: qty 1000

## 2017-11-04 MED ORDER — ACETAMINOPHEN 325 MG PO TABS
650.0000 mg | ORAL_TABLET | ORAL | Status: DC | PRN
  Administered 2017-11-05 (×2): 650 mg via ORAL
  Filled 2017-11-04 (×2): qty 2

## 2017-11-04 MED ORDER — DIBUCAINE 1 % RE OINT: 1.0000 "application " | TOPICAL_OINTMENT | RECTAL | Status: DC | PRN

## 2017-11-04 MED ORDER — SOD CITRATE-CITRIC ACID 500-334 MG/5ML PO SOLN: 30.0000 mL | ORAL | Status: DC | PRN

## 2017-11-04 MED ORDER — TERBUTALINE SULFATE 1 MG/ML IJ SOLN
0.2500 mg | Freq: Once | INTRAMUSCULAR | Status: DC | PRN
  Filled 2017-11-04: qty 1

## 2017-11-04 MED ORDER — SENNOSIDES-DOCUSATE SODIUM 8.6-50 MG PO TABS
2.0000 | ORAL_TABLET | ORAL | Status: DC
  Administered 2017-11-04 – 2017-11-05 (×2): 2 via ORAL
  Filled 2017-11-04: qty 2

## 2017-11-04 MED ORDER — PHENYLEPHRINE 40 MCG/ML (10ML) SYRINGE FOR IV PUSH (FOR BLOOD PRESSURE SUPPORT)
80.0000 ug | PREFILLED_SYRINGE | INTRAVENOUS | Status: DC | PRN
  Filled 2017-11-04: qty 10
  Filled 2017-11-04: qty 5

## 2017-11-04 MED ORDER — ACETAMINOPHEN 325 MG PO TABS: 650.0000 mg | ORAL_TABLET | ORAL | Status: DC | PRN

## 2017-11-04 MED ORDER — OXYCODONE-ACETAMINOPHEN 5-325 MG PO TABS: 2.0000 | ORAL_TABLET | ORAL | Status: DC | PRN

## 2017-11-04 MED ORDER — FENTANYL 2.5 MCG/ML BUPIVACAINE 1/10 % EPIDURAL INFUSION (WH - ANES)
14.0000 mL/h | INTRAMUSCULAR | Status: DC | PRN
  Administered 2017-11-04: 14 mL/h via EPIDURAL
  Filled 2017-11-04: qty 100

## 2017-11-04 MED ORDER — LACTATED RINGERS IV SOLN
500.0000 mL | Freq: Once | INTRAVENOUS | Status: AC
  Administered 2017-11-04: 500 mL via INTRAVENOUS

## 2017-11-04 MED ORDER — MISOPROSTOL 200 MCG PO TABS
800.0000 ug | ORAL_TABLET | Freq: Once | ORAL | Status: AC
  Administered 2017-11-04: 800 ug via BUCCAL

## 2017-11-04 MED ORDER — MISOPROSTOL 25 MCG QUARTER TABLET: 25.0000 ug | ORAL_TABLET | ORAL | Status: DC | PRN

## 2017-11-04 MED ORDER — TETANUS-DIPHTH-ACELL PERTUSSIS 5-2.5-18.5 LF-MCG/0.5 IM SUSP: 0.5000 mL | Freq: Once | INTRAMUSCULAR | Status: DC

## 2017-11-04 MED ORDER — OXYTOCIN 40 UNITS IN LACTATED RINGERS INFUSION - SIMPLE MED
1.0000 m[IU]/min | INTRAVENOUS | Status: DC
  Administered 2017-11-04: 2 m[IU]/min via INTRAVENOUS

## 2017-11-04 MED ORDER — MISOPROSTOL 200 MCG PO TABS
ORAL_TABLET | ORAL | Status: AC
  Filled 2017-11-04: qty 4

## 2017-11-04 MED ORDER — MISOPROSTOL 50MCG HALF TABLET
50.0000 ug | ORAL_TABLET | Freq: Once | ORAL | Status: AC
  Administered 2017-11-04: 50 ug via ORAL
  Filled 2017-11-04: qty 1

## 2017-11-04 MED ORDER — PRENATAL MULTIVITAMIN CH
1.0000 | ORAL_TABLET | Freq: Every day | ORAL | Status: DC
  Administered 2017-11-05: 1 via ORAL
  Filled 2017-11-04: qty 1

## 2017-11-04 MED ORDER — WITCH HAZEL-GLYCERIN EX PADS: 1.0000 "application " | MEDICATED_PAD | CUTANEOUS | Status: DC | PRN

## 2017-11-04 MED ORDER — MEASLES, MUMPS & RUBELLA VAC ~~LOC~~ INJ
0.5000 mL | INJECTION | Freq: Once | SUBCUTANEOUS | Status: DC
  Filled 2017-11-04: qty 0.5

## 2017-11-04 MED ORDER — PHENYLEPHRINE 40 MCG/ML (10ML) SYRINGE FOR IV PUSH (FOR BLOOD PRESSURE SUPPORT)
80.0000 ug | PREFILLED_SYRINGE | INTRAVENOUS | Status: DC | PRN
  Filled 2017-11-04: qty 5

## 2017-11-04 MED ORDER — ONDANSETRON HCL 4 MG/2ML IJ SOLN: 4.0000 mg | Freq: Four times a day (QID) | INTRAMUSCULAR | Status: DC | PRN

## 2017-11-04 MED ORDER — LIDOCAINE HCL (PF) 1 % IJ SOLN
INTRAMUSCULAR | Status: DC | PRN
  Administered 2017-11-04: 2 mL via EPIDURAL
  Administered 2017-11-04: 3 mL via EPIDURAL
  Administered 2017-11-04: 5 mL via EPIDURAL

## 2017-11-04 MED ORDER — COCONUT OIL OIL: 1.0000 "application " | TOPICAL_OIL | Status: DC | PRN

## 2017-11-04 MED ORDER — LACTATED RINGERS IV SOLN: 500.0000 mL | Freq: Once | INTRAVENOUS | Status: DC

## 2017-11-04 MED ORDER — BENZOCAINE-MENTHOL 20-0.5 % EX AERO
1.0000 "application " | INHALATION_SPRAY | CUTANEOUS | Status: DC | PRN
  Filled 2017-11-04: qty 56

## 2017-11-04 MED ORDER — ONDANSETRON HCL 4 MG PO TABS: 4.0000 mg | ORAL_TABLET | ORAL | Status: DC | PRN

## 2017-11-04 NOTE — Anesthesia Pain Management Evaluation Note (Signed)
  CRNA Pain Management Visit Note  Patient: Jasmine Ritter, 32 y.o., female  "Hello I am a member of the anesthesia team at [facility]. We have an anesthesia team available at all times to provide care throughout the hospital, including epidural management and anesthesia for C-section. I don't know your plan for the delivery whether it a natural birth, water birth, IV sedation, nitrous supplementation, doula or epidural, but we want to meet your pain goals."   1.Was your pain managed to your expectations on prior hospitalizations?   Yes   2.What is your expectation for pain management during this hospitalization?     Labor support without medications  3.How can we help you reach that goal? Be available for emergency.  Record the patient's initial score and the patient's pain goal.   Pain: 3  Pain Goal: 10 [facility] wants you to be able to say your pain was always managed very well.  Linzey Ramser 11/04/2017

## 2017-11-04 NOTE — Anesthesia Preprocedure Evaluation (Signed)
Anesthesia Evaluation  Patient identified by MRN, date of birth, ID band Patient awake    Reviewed: Allergy & Precautions, Patient's Chart, lab work & pertinent test results  Airway Mallampati: II  TM Distance: >3 FB     Dental   Pulmonary neg pulmonary ROS,    Pulmonary exam normal        Cardiovascular negative cardio ROS Normal cardiovascular exam     Neuro/Psych negative neurological ROS     GI/Hepatic negative GI ROS, Neg liver ROS,   Endo/Other  negative endocrine ROS  Renal/GU negative Renal ROS     Musculoskeletal   Abdominal   Peds  Hematology negative hematology ROS (+)   Anesthesia Other Findings   Reproductive/Obstetrics (+) Pregnancy                             Lab Results  Component Value Date   WBC 5.9 11/04/2017   HGB 10.7 (L) 11/04/2017   HCT 33.1 (L) 11/04/2017   MCV 89.0 11/04/2017   PLT 129 (L) 11/04/2017    Anesthesia Physical Anesthesia Plan  ASA: II  Anesthesia Plan: Epidural   Post-op Pain Management:    Induction:   PONV Risk Score and Plan: Treatment may vary due to age or medical condition  Airway Management Planned: Natural Airway  Additional Equipment:   Intra-op Plan:   Post-operative Plan:   Informed Consent: I have reviewed the patients History and Physical, chart, labs and discussed the procedure including the risks, benefits and alternatives for the proposed anesthesia with the patient or authorized representative who has indicated his/her understanding and acceptance.     Plan Discussed with:   Anesthesia Plan Comments:         Anesthesia Quick Evaluation

## 2017-11-04 NOTE — H&P (Signed)
LABOR AND DELIVERY ADMISSION HISTORY AND PHYSICAL NOTE  Jasmine Ritter is a 32 y.o. female G3P2003 with IUP at [redacted]w[redacted]d by LMP presenting for IOL for postdates.  Feeling well.  All prior babies have been around 7lbs. Second pregnancy boys delivered pretty quickly (2 hour labor and pushed very little). No painful contractions yet.  She reports positive fetal movement. She denies leakage of fluid or vaginal bleeding.  Prenatal History/Complications: PNC at [facility] Pregnancy complications:  - none - declines Tdap and flu  Past Medical History: Past Medical History:  Diagnosis Date  . ADHD     Past Surgical History: Past Surgical History:  Procedure Laterality Date  . NO PAST SURGERIES      Obstetrical History: OB History    Gravida  3   Para  2   Term  2   Preterm  0   AB  0   Living  3     SAB  0   TAB  0   Ectopic  0   Multiple  1   Live Births  3           Social History: Social History   Socioeconomic History  . Marital status: Married    Spouse name: Not on file  . Number of children: Not on file  . Years of education: Not on file  . Highest education level: Not on file  Occupational History  . Not on file  Social Needs  . Financial resource strain: Not hard at all  . Food insecurity:    Worry: Never true    Inability: Never true  . Transportation needs:    Medical: No    Non-medical: Not on file  Tobacco Use  . Smoking status: Never Smoker  . Smokeless tobacco: Never Used  Substance and Sexual Activity  . Alcohol use: Not Currently  . Drug use: No  . Sexual activity: Yes    Birth control/protection: None  Lifestyle  . Physical activity:    Days per week: Not on file    Minutes per session: Not on file  . Stress: Not on file  Relationships  . Social connections:    Talks on phone: Not on file    Gets together: Not on file    Attends religious service: Not on file    Active member of club or organization: Not on file    Attends  meetings of clubs or organizations: Not on file    Relationship status: Not on file  Other Topics Concern  . Not on file  Social History Narrative  . Not on file    Family History: Family History  Problem Relation Age of Onset  . Cancer Mother   . Diabetes Paternal Grandfather     Allergies: No Known Allergies  Medications Prior to Admission  Medication Sig Dispense Refill Last Dose  . cyclobenzaprine (FLEXERIL) 10 MG tablet Take 0.5-1 tablets (5-10 mg total) by mouth 3 (three) times daily as needed for muscle spasms. 20 tablet 0 Taking  . Prenatal Vit-Fe Fumarate-FA (MULTIVITAMIN-PRENATAL) 27-0.8 MG TABS tablet Take 1 tablet by mouth daily at 12 noon.   Taking     Review of Systems  All systems reviewed and negative except as stated in HPI  Physical Exam Height 5\' 4"  (1.626 m), weight 86.8 kg, last menstrual period 01/24/2017, unknown if currently breastfeeding. General appearance: alert, oriented, NAD Lungs: normal respiratory effort Heart: regular rate Abdomen: soft, non-tender; gravid, FH appropriate for GA Extremities:  No calf swelling or tenderness Presentation: cephalic Fetal monitoring: 130s/mod variability/+ acels/no decels Uterine activity: irregular contractions Dilation: 2 Effacement (%): 60, 70 Station: -1 Exam by:: Craige Cotta, RNC  Prenatal labs: ABO, Rh: A/Positive/-- (04/16 1504) Antibody: Negative (04/16 1504) Rubella: 2.31 (04/16 1504) RPR: Non Reactive (07/17 0831)  HBsAg: Negative (04/16 1504)  HIV: Non Reactive (07/17 0831)  GC/Chlamydia: neg/neg GBS:   neg 2-hr GTT: 94/140/120 Genetic screening:  neg Anatomy US: normal  Prenatal Transfer Tool  Maternal Diabetes: No Genetic Screening: Normal Maternal Ultrasounds/Referrals: Normal Fetal Ultrasounds or other Referrals:  None Maternal Substance Abuse:  No Significant Maternal Medications:  None Significant Maternal Lab Results: None  Results for orders placed or performed during the  hospital encounter of 11/04/17 (from the past 24 hour(s))  CBC   Collection Time: 11/04/17  8:42 AM  Result Value Ref Range   WBC 5.9 4.0 - 10.5 K/uL   RBC 3.72 (L) 3.87 - 5.11 MIL/uL   Hemoglobin 10.7 (L) 12.0 - 15.0 g/dL   HCT 16.1 (L) 09.6 - 04.5 %   MCV 89.0 80.0 - 100.0 fL   MCH 28.8 26.0 - 34.0 pg   MCHC 32.3 30.0 - 36.0 g/dL   RDW 40.9 (H) 81.1 - 91.4 %   Platelets 129 (L) 150 - 400 K/uL   nRBC 0.0 0.0 - 0.2 %    Patient Active Problem List   Diagnosis Date Noted  . Indication for care in labor and delivery, antepartum 11/04/2017  . NST (non-stress test) reactive 10/21/2017  . Supervision of low-risk pregnancy 05/02/2017  . Severe major depression without psychotic features (HCC) 02/24/2015    Assessment: Jasmine Ritter is a 32 y.o. G3P2003 at [redacted]w[redacted]d here for IOL for postdates  #Labor: start with cytotec x 1, then pitocin  #Pain: Desires unmedicated delivery  #FWB: Cat I strip  #ID:  GBS neg #MOF: plans to attempt breast, but hasn't been very successful in the past #MOC:nexplanon  #Circ:  Outpatient   Gwenevere Abbot, MD 11/04/2017, 9:17 AM

## 2017-11-04 NOTE — Anesthesia Procedure Notes (Signed)
Epidural Patient location during procedure: OB Start time: 11/04/2017 7:26 PM End time: 11/04/2017 7:31 PM  Staffing Anesthesiologist: Marcene Duos, MD Performed: anesthesiologist   Preanesthetic Checklist Completed: patient identified, site marked, surgical consent, pre-op evaluation, timeout performed, IV checked, risks and benefits discussed and monitors and equipment checked  Epidural Patient position: sitting Prep: site prepped and draped and DuraPrep Patient monitoring: continuous pulse ox and blood pressure Approach: midline Location: L4-L5 Injection technique: LOR air  Needle:  Needle type: Tuohy  Needle gauge: 17 G Needle length: 9 cm and 9 Needle insertion depth: 5 cm cm Catheter type: closed end flexible Catheter size: 19 Gauge Catheter at skin depth: 10 cm Test dose: negative  Assessment Events: blood not aspirated, injection not painful, no injection resistance, negative IV test and no paresthesia

## 2017-11-04 NOTE — Progress Notes (Signed)
LABOR PROGRESS NOTE  Jasmine Ritter is a 32 y.o. G3P2003 at [redacted]w[redacted]d  admitted for IOL for postdates.   Subjective: Painful contractions Considering IV pain meds Discussed AROM + IUPC if no change which pt is agreeable to   Objective: BP (!) 136/93   Pulse 87   Temp 97.7 F (36.5 C) (Oral)   Resp 18   Ht 5\' 4"  (1.626 m)   Wt 86.8 kg   LMP 01/24/2017   BMI 32.83 kg/m  or  Vitals:   11/04/17 1600 11/04/17 1700 11/04/17 1800 11/04/17 1804  BP: 107/90 119/75 (!) 136/93 (!) 136/93  Pulse: 96 87 87 87  Resp: 18     Temp:      TempSrc:      Weight:      Height:        Dilation: 3 Effacement (%): 70 Cervical Position: Middle Station: -2 Presentation: Vertex Exam by:: Dr. Janina Mayo: baseline rate 130s, moderate varibility, + acel, no decel Toco: regular contractions q2-54m  Labs: Lab Results  Component Value Date   WBC 5.9 11/04/2017   HGB 10.7 (L) 11/04/2017   HCT 33.1 (L) 11/04/2017   MCV 89.0 11/04/2017   PLT 129 (L) 11/04/2017    Patient Active Problem List   Diagnosis Date Noted  . Indication for care in labor and delivery, antepartum 11/04/2017  . NST (non-stress test) reactive 10/21/2017  . Supervision of low-risk pregnancy 05/02/2017  . Severe major depression without psychotic features (HCC) 02/24/2015    Assessment / Plan: 32 y.o. G3P2003 at [redacted]w[redacted]d here for IOL for postdates  Labor: AROM now + IUPC since no cervical change despite pitocin  Fetal Wellbeing:  Cat I strip  Pain Control:  Desires IV pain meds. Would like to avoid epidural  Anticipated MOD:  SVD  Gwenevere Abbot, MD OB Fellow  11/04/2017, 6:29 PM

## 2017-11-05 LAB — RPR: RPR: NONREACTIVE

## 2017-11-05 NOTE — Anesthesia Postprocedure Evaluation (Signed)
Anesthesia Post Note  Patient: Jasmine Ritter  Procedure(s) Performed: AN AD HOC LABOR EPIDURAL     Patient location during evaluation: Mother Baby Anesthesia Type: Epidural Level of consciousness: awake and alert, oriented and patient cooperative Pain management: pain level controlled Vital Signs Assessment: post-procedure vital signs reviewed and stable Respiratory status: spontaneous breathing Cardiovascular status: stable Postop Assessment: no headache, epidural receding, patient able to bend at knees and no signs of nausea or vomiting Anesthetic complications: no Comments: Pain score 7.  Pt encouraged to communicate pain score with bedside RN and communicate effectiveness/ineffectiveness of pain med.    Last Vitals:  Vitals:   11/04/17 2245 11/05/17 0643  BP: 116/81 128/73  Pulse: 76 67  Resp: 18 16  Temp: 36.7 C 36.9 C  SpO2: 100%     Last Pain:  Vitals:   11/05/17 0643  TempSrc: Oral  PainSc: 0-No pain   Pain Goal: Patients Stated Pain Goal: 5 (11/04/17 1306)               Merrilyn Puma

## 2017-11-05 NOTE — Lactation Note (Signed)
This note was copied from a baby's chart. Lactation Consultation Note  Patient Name: Jasmine Ritter [id number] Date: 11/05/2017   Dahlia Client, RN shared her concern about infant's feedings. Mom reports that infant will cue and then he will fall asleep at the breast. Infant is cueing now, but Mom has just received her dinner.   Mom has my # to call when ready for consult. Upon return, we may hand express & spoon feed.   Mom reports having a lot of milk when lactating with her 1st child. She says she is undecided about pumping.  Lurline Hare [facility] 11/05/2017, 7:11 PM

## 2017-11-05 NOTE — Progress Notes (Signed)
Post Partum Day 1 Subjective: Doing well. Does have some bilateral groin pain. Felt slightly off balance getting up to go to bathroom.   Objective: Blood pressure 128/73, pulse 67, temperature 98.4 F (36.9 C), temperature source Oral, resp. rate 16, height 5\' 4"  (1.626 m), weight 86.8 kg, last menstrual period 01/24/2017, SpO2 100 %, unknown if currently breastfeeding.  Physical Exam:  General: sitting up in bed, well-appearing Lochia: appropriate Uterine Fundus: firm Incision: N/A DVT Evaluation: No significant calf/ankle edema.  Recent Labs    11/04/17 0842  HGB 10.7*  HCT 33.1*    Assessment/Plan: Plan for discharge tomorrow  Recommended continuing ibuprofen and heating packs for groin pain. Encouraged to get up and move around a little bit today. Has prn percocet. If still unsteady, will consult PT given risk of pubic symphysis dysfunction with shoulder dystocia maneuvers.    LOS: 1 day   Tamera Stands, DO 11/05/2017, 11:11 AM

## 2017-11-05 NOTE — Lactation Note (Signed)
This note was copied from a baby's chart. Lactation Consultation Note  Patient Name: Jasmine Ritter [id number] Date: 11/05/2017  RN made aware that Mom had not called for latch assist. RN reported that she had recently seen infant nurse & he was nursing very well.  Lurline Hare [facility] 11/05/2017, 10:56 PM

## 2017-11-05 NOTE — Lactation Note (Signed)
This note was copied from a baby's chart. Lactation Consultation Note Baby 10 hrs old. Has been spitting up per mom. Baby has no interest in BF at this time.  Discussed I&O, STS, feeding habits, jaundice, supply and demand. Mom tried to BF her now 32 yr old for 4 months. She had a tongue-tie and made it to painful to cont.  Baby has bruising to face. If baby needed to go on photo therapy, suggested mom to pump and supplement.  Mom encouraged to feed baby 8-12 times/24 hours and with feeding cues.  Encouraged mom to call for assistance or questions. Baby doing slight grunting occasionally. Mom stated baby had been sounding like that. Encouraged mom if status change. Baby not interested in BF. RN Placed STS and encouraged to hand express and spoon feed. WH/LC brochure given w/resources, support groups and LC services.  Patient Name: Jasmine Ritter [id number] Date: 11/05/2017 Reason for consult: Initial assessment   Maternal Data Has patient been taught Hand Expression?: Yes Does the patient have breastfeeding experience prior to this delivery?: Yes  Feeding    LATCH Score       Type of Nipple: Everted at rest and after stimulation(short shaft)  Comfort (Breast/Nipple): Soft / non-tender        Interventions Interventions: Breast feeding basics reviewed;Support pillows;Position options;Breast massage;Hand express;Breast compression  Lactation Tools Discussed/Used WIC Program: No   Consult Status Consult Status: Follow-up Date: 11/06/17 Follow-up type: In-patient    Charyl Dancer 11/05/2017, 5:54 AM

## 2017-11-06 NOTE — Lactation Note (Signed)
This note was copied from a baby's chart. Lactation Consultation Note  Patient Name: Jasmine Ritter [id number] Date: 11/06/2017 Reason for consult: Follow-up assessment;Term  LC Follow Up Visit:  Mother called for latch assistance  Assisted baby to latch on the right breast in the football hold.  He was very restless and was passing gas.  I burped him and assisted to latch again.  Mother reminded to keep her fingers back from areola and to wait for a wide open mouth before latching.  I suggested she feed STS but she wanted to keep his big sleeper on.   He would suck 2-3 times and become agitated.  Assisted another few times with the same result.  Mother wanted to wait a little while longer before trying to latch again.  She will call as needed.  RN updated.   Maternal Data Formula Feeding for Exclusion: No Has patient been taught Hand Expression?: Yes  Feeding Feeding Type: Breast Fed  LATCH Score Latch: Grasps breast easily, tongue down, lips flanged, rhythmical sucking.  Audible Swallowing: A few with stimulation  Type of Nipple: Everted at rest and after stimulation  Comfort (Breast/Nipple): Soft / non-tender  Hold (Positioning): Assistance needed to correctly position infant at breast and maintain latch.  LATCH Score: 8  Interventions    Lactation Tools Discussed/Used     Consult Status Consult Status: Follow-up Date: 11/06/17 Follow-up type: In-patient    Jacobi Ryant R Kameran Lallier 11/06/2017, 11:34 AM

## 2017-11-06 NOTE — Lactation Note (Signed)
This note was copied from a baby's chart. Lactation Consultation Note  Patient Name: Jasmine Ritter [id number] Date: 11/06/2017 Reason for consult: Follow-up assessment;Term  P4 mother whose infant is now 70 hours old  Baby was sleeping in bassinet when I arrived.  Mother stated that she feels a "pinching" when he latches and I noticed that his feeding times are short.  I offered my assistance with the next latch and mother will call when he awakens.  Educated on the importance of obtaining a deep latch; will discuss more when mother calls out.   Maternal Data Formula Feeding for Exclusion: No Has patient been taught Hand Expression?: Yes  Feeding Feeding Type: Breast Fed  LATCH Score                   Interventions    Lactation Tools Discussed/Used     Consult Status Consult Status: Follow-up Date: 11/06/17 Follow-up type: In-patient    Saliha Salts R Ethal Gotay 11/06/2017, 8:20 AM

## 2017-11-06 NOTE — Progress Notes (Signed)
MOB was referred for history of depression/anxiety- CSW notes no complaints of anxiety/ depression in chart:  * Referral screened out by Clinical Social Worker because none of the following criteria appear to apply: ~ History of anxiety/depression during this pregnancy, or of post-partum depression following prior delivery. ~ Diagnosis of anxiety and/or depression within last 3 years OR * MOB's symptoms currently being treated with medication and/or therapy. Please contact the Clinical Social Worker if needs arise, by MOB request, or if MOB scores greater than 9/yes to question 10 on Edinburgh Postpartum Depression Screen.  Laysa Kimmey, LCSW Clinical Social Worker  [facility] [employer]  [phone]   

## 2017-11-06 NOTE — Discharge Summary (Signed)
OB Discharge Summary     Patient Name: Jasmine Ritter DOB: October 16, 1985 MRN: [id number]  Date of admission: 11/04/2017 Delivering MD: Catalina Antigua   Date of discharge: 11/06/2017  Admitting diagnosis: 40 WKS INDUCTION Intrauterine pregnancy: [redacted]w[redacted]d     Secondary diagnosis:  Active Problems:   Indication for care in labor and delivery, antepartum   Shoulder dystocia, delivered  Additional problems: SVD     Discharge diagnosis: Term Pregnancy Delivered                                                                                                Post partum procedures:none  Augmentation: AROM, Pitocin and Cytotec  Complications: None  Hospital course:  Induction of Labor With Vaginal Delivery   32 y.o. yo [id number] at [redacted]w[redacted]d was admitted to the hospital 11/04/2017 for induction of labor.  Indication for induction: Postdates.  Patient had an uncomplicated labor course as follows: Membrane Rupture Time/Date: 6:00 PM ,11/04/2017   Intrapartum Procedures: Episiotomy: None [1]                                         Lacerations:  2nd degree [3];Perineal [11]  Patient had delivery of a Viable infant.  Information for the patient's newborn:  Clemence, Lengyel [[id number]]  Delivery Method: Vaginal, Spontaneous(Filed from Delivery Summary)   11/04/2017  Details of delivery can be found in separate delivery note.  Patient had a routine postpartum course. Patient is discharged home 11/06/17.  Physical exam  Vitals:   11/05/17 1030 11/05/17 1825 11/05/17 2015 11/06/17 0548  BP: 131/68 114/78 110/75 114/83  Pulse: 64 79 77 62  Resp: 18 18 16 16   Temp: 97.9 F (36.6 C) (!) 97.4 F (36.3 C) 98 F (36.7 C) 98.4 F (36.9 C)  TempSrc: Oral Oral Oral Oral  SpO2:      Weight:      Height:       General: alert, cooperative and no distress Lochia: appropriate Uterine Fundus: firm Incision: N/A DVT Evaluation: No evidence of DVT seen on physical exam. No cords or calf  tenderness. No significant calf/ankle edema. Labs: Lab Results  Component Value Date   WBC 5.9 11/04/2017   HGB 10.7 (L) 11/04/2017   HCT 33.1 (L) 11/04/2017   MCV 89.0 11/04/2017   PLT 129 (L) 11/04/2017   CMP Latest Ref Rng & Units 02/24/2015  Glucose 65 - 99 mg/dL 130(Q)  BUN 6 - 20 mg/dL 11  Creatinine 6.57 - 8.46 mg/dL 9.62  Sodium 952 - 841 mmol/L 138  Potassium 3.5 - 5.1 mmol/L 3.8  Chloride 101 - 111 mmol/L 106  CO2 22 - 32 mmol/L 25  Calcium 8.9 - 10.3 mg/dL 9.1  Total Protein 6.5 - 8.1 g/dL 7.5  Total Bilirubin 0.3 - 1.2 mg/dL 0.9  Alkaline Phos 38 - 126 U/L 59  AST 15 - 41 U/L 13(L)  ALT 14 - 54 U/L 14    Discharge instruction: per After Visit Summary and "Baby  and Me Booklet".  After visit meds:  Allergies as of 11/06/2017   No Known Allergies     Medication List    TAKE these medications   cyclobenzaprine 10 MG tablet Commonly known as:  FLEXERIL Take 0.5-1 tablets (5-10 mg total) by mouth 3 (three) times daily as needed for muscle spasms.   multivitamin-prenatal 27-0.8 MG Tabs tablet Take 1 tablet by mouth daily at 12 noon.       Diet: routine diet  Activity: Advance as tolerated. Pelvic rest for 6 weeks.   Outpatient follow up:4 weeks Follow up Appt:No future appointments. Follow up Visit:No follow-ups on file.  Postpartum contraception: Nexplanon  Newborn Data: Live born female  Birth Weight: 8 lb 14.2 oz (4031 g) APGAR: 7, 9  Newborn Delivery   Time head delivered:  11/04/2017 19:40:00 Birth date/time:  11/04/2017 19:42:00 Delivery type:  Vaginal, Spontaneous     Baby Feeding: Breast Disposition:home with mother   11/06/2017 Sharyon Cable, CNM

## 2017-11-07 ENCOUNTER — Inpatient Hospital Stay (HOSPITAL_COMMUNITY): Admission: RE | Admit: 2017-11-07 | Payer: Medicaid Other | Source: Ambulatory Visit

## 2017-12-12 ENCOUNTER — Ambulatory Visit (INDEPENDENT_AMBULATORY_CARE_PROVIDER_SITE_OTHER): Payer: Medicaid Other | Admitting: Student

## 2017-12-12 ENCOUNTER — Encounter: Payer: Self-pay | Admitting: Student

## 2017-12-12 DIAGNOSIS — Z1389 Encounter for screening for other disorder: Secondary | ICD-10-CM

## 2017-12-12 NOTE — Patient Instructions (Signed)
Etonogestrel implant What is this medicine? ETONOGESTREL (et oh noe JES trel) is a contraceptive (birth control) device. It is used to prevent pregnancy. It can be used for up to 3 years. This medicine may be used for other purposes; ask your health care provider or pharmacist if you have questions. COMMON BRAND NAME(S): Implanon, Nexplanon What should I tell my health care provider before I take this medicine? They need to know if you have any of these conditions: -abnormal vaginal bleeding -blood vessel disease or blood clots -cancer of the breast, cervix, or liver -depression -diabetes -gallbladder disease -headaches -heart disease or recent heart attack -high blood pressure -high cholesterol -kidney disease -liver disease -renal disease -seizures -tobacco smoker -an unusual or allergic reaction to etonogestrel, other hormones, anesthetics or antiseptics, medicines, foods, dyes, or preservatives -pregnant or trying to get pregnant -breast-feeding How should I use this medicine? This device is inserted just under the skin on the inner side of your upper arm by a health care professional. Talk to your pediatrician regarding the use of this medicine in children. Special care may be needed. Overdosage: If you think you have taken too much of this medicine contact a poison control center or emergency room at once. NOTE: This medicine is only for you. Do not share this medicine with others. What if I miss a dose? This does not apply. What may interact with this medicine? Do not take this medicine with any of the following medications: -amprenavir -bosentan -fosamprenavir This medicine may also interact with the following medications: -barbiturate medicines for inducing sleep or treating seizures -certain medicines for fungal infections like ketoconazole and itraconazole -grapefruit juice -griseofulvin -medicines to treat seizures like carbamazepine, felbamate, oxcarbazepine,  phenytoin, topiramate -modafinil -phenylbutazone -rifampin -rufinamide -some medicines to treat HIV infection like atazanavir, indinavir, lopinavir, nelfinavir, tipranavir, ritonavir -St. John's wort This list may not describe all possible interactions. Give your health care provider a list of all the medicines, herbs, non-prescription drugs, or dietary supplements you use. Also tell them if you smoke, drink alcohol, or use illegal drugs. Some items may interact with your medicine. What should I watch for while using this medicine? This product does not protect you against HIV infection (AIDS) or other sexually transmitted diseases. You should be able to feel the implant by pressing your fingertips over the skin where it was inserted. Contact your doctor if you cannot feel the implant, and use a non-hormonal birth control method (such as condoms) until your doctor confirms that the implant is in place. If you feel that the implant may have broken or become bent while in your arm, contact your healthcare provider. What side effects may I notice from receiving this medicine? Side effects that you should report to your doctor or health care professional as soon as possible: -allergic reactions like skin rash, itching or hives, swelling of the face, lips, or tongue -breast lumps -changes in emotions or moods -depressed mood -heavy or prolonged menstrual bleeding -pain, irritation, swelling, or bruising at the insertion site -scar at site of insertion -signs of infection at the insertion site such as fever, and skin redness, pain or discharge -signs of pregnancy -signs and symptoms of a blood clot such as breathing problems; changes in vision; chest pain; severe, sudden headache; pain, swelling, warmth in the leg; trouble speaking; sudden numbness or weakness of the face, arm or leg -signs and symptoms of liver injury like dark yellow or brown urine; general ill feeling or flu-like symptoms;  light-colored   stools; loss of appetite; nausea; right upper belly pain; unusually weak or tired; yellowing of the eyes or skin -unusual vaginal bleeding, discharge -signs and symptoms of a stroke like changes in vision; confusion; trouble speaking or understanding; severe headaches; sudden numbness or weakness of the face, arm or leg; trouble walking; dizziness; loss of balance or coordination Side effects that usually do not require medical attention (report to your doctor or health care professional if they continue or are bothersome): -acne -back pain -breast pain -changes in weight -dizziness -general ill feeling or flu-like symptoms -headache -irregular menstrual bleeding -nausea -sore throat -vaginal irritation or inflammation This list may not describe all possible side effects. Call your doctor for medical advice about side effects. You may report side effects to FDA at 1-800-FDA-1088. Where should I keep my medicine? This drug is given in a hospital or clinic and will not be stored at home. NOTE: This sheet is a summary. It may not cover all possible information. If you have questions about this medicine, talk to your doctor, pharmacist, or health care provider.  2018 Elsevier/Gold Standard (2015-07-23 11:19:22)  

## 2017-12-12 NOTE — Progress Notes (Signed)
Subjective:     Jasmine Ritter is a 32 y.o. female who presents for a postpartum visit. She is 5 weeks postpartum following a spontaneous vaginal delivery. I have fully reviewed the prenatal and intrapartum course. The delivery was at 5470w4d gestational weeks. Outcome: spontaneous vaginal delivery. Anesthesia: epidural. Postpartum course has been unremarkable. Baby's course has been uneventful. Baby is feeding by bottle-gerber gentle-ease. Bleeding no bleeding. Bowel function is normal. Bladder function is normal. Patient is sexually active. Contraception method is Nexplanon. Postpartum depression screening: negative. Declines to get unaddressed to view her laceration; she stays it is not bothering her.   Baby has been seen by peds and shoulder is fine; she is not worried about it.  The following portions of the patient's history were reviewed and updated as appropriate: allergies, current medications, past family history, past medical history, past social history, past surgical history and problem list.  Review of Systems Pertinent items are noted in HPI.   Objective:    BP 119/68   Pulse (!) 56   Ht 5\' 3"  (1.6 m)   Wt 159 lb 8 oz (72.3 kg)   LMP 01/24/2017   Breastfeeding? No   BMI 28.25 kg/m   General:  alert and cooperative   Breasts:  negative  Lungs: clear to auscultation bilaterally  Heart:  regular rate and rhythm, S1, S2 normal, no murmur, click, rub or gallop  Abdomen: soft, non-tender; bowel sounds normal; no masses,  no organomegaly   Vulva:  not evaluated  Vagina: not evaluated  Cervix:  not evaluated  Corpus: not examined  Adnexa:  not evaluated  Rectal Exam: Not performed.        Assessment:    Healthy  postpartum exam. Pap smear not done at today's visit. UPT negative.   Plan:    1. Contraception: she had intercourse within the past week; she did not use protection.  2.  Patient will return next week for nexplanon placement.  3. Follow up in: 1 week or as  needed.

## 2017-12-19 ENCOUNTER — Ambulatory Visit (INDEPENDENT_AMBULATORY_CARE_PROVIDER_SITE_OTHER): Payer: Medicaid Other | Admitting: Student

## 2017-12-19 VITALS — BP 120/75 | HR 52 | Wt 164.0 lb

## 2017-12-19 DIAGNOSIS — Z30017 Encounter for initial prescription of implantable subdermal contraceptive: Secondary | ICD-10-CM | POA: Diagnosis not present

## 2017-12-19 LAB — POCT PREGNANCY, URINE: Preg Test, Ur: NEGATIVE

## 2017-12-19 MED ORDER — ETONOGESTREL 68 MG ~~LOC~~ IMPL
68.0000 mg | DRUG_IMPLANT | Freq: Once | SUBCUTANEOUS | Status: AC
  Administered 2017-12-19: 68 mg via SUBCUTANEOUS

## 2017-12-19 NOTE — Progress Notes (Signed)
   History:  Ms. Jasmine Ritter is a 32 y.o. [id number] who presents to clinic today for Nexplanon placement.   The following portions of the patient's history were reviewed and updated as appropriate: allergies, current medications, family history, past medical history, social history, past surgical history and problem list.  Review of Systems:  Review of Systems  Constitutional: Negative.   HENT: Negative.   Eyes: Negative.   Respiratory: Negative.   Cardiovascular: Negative.   Genitourinary: Negative.   Musculoskeletal: Negative.   Neurological: Negative.   Psychiatric/Behavioral: Negative.       Objective:  Physical Exam BP 120/75   Pulse (!) 52   Wt 164 lb (74.4 kg)   BMI 29.05 kg/m  Physical Exam  Constitutional: She is oriented to person, place, and time. She appears well-developed.  Pulmonary/Chest: Effort normal.  Abdominal: Soft.  Genitourinary: Vagina normal.  Musculoskeletal: Normal range of motion.  Neurological: She is alert and oriented to person, place, and time.  Skin: Skin is warm.     Labs and Imaging Results for orders placed or performed in visit on 12/19/17 (from the past 24 hour(s))  Pregnancy, urine POC     Status: None   Collection Time: 12/19/17  5:20 PM  Result Value Ref Range   Preg Test, Ur NEGATIVE NEGATIVE    No results found.   Assessment & Plan:   1. Nexplanon insertion    2. Pregnancy test negative today.  Marylene LandKooistra, Kizzie Cotten Lorraine, CNM 12/19/2017 6:59 PM    NEXPLANON INSERTION: Appropriate time out taken. Nexlanon site (left arm) identified and the area was prepped in usual sterile fashon. 2 cc of 1% lidocaine was used to anesthetize the area starting with the distal end.   Next, the area was cleansed with betadine and the Nexplanon was inserted without difficulty.  Pressure bandage was applied.  Pt was instructed to remove pressure bandage in a few hours, and keep insertion site covered with a bandaid for 3 days.

## 2018-01-08 ENCOUNTER — Ambulatory Visit: Payer: Self-pay | Admitting: Family Medicine

## 2018-01-08 ENCOUNTER — Encounter: Payer: Self-pay | Admitting: Advanced Practice Midwife

## 2018-01-08 ENCOUNTER — Ambulatory Visit (INDEPENDENT_AMBULATORY_CARE_PROVIDER_SITE_OTHER): Payer: Medicaid Other | Admitting: Advanced Practice Midwife

## 2018-01-08 VITALS — BP 121/72 | HR 76 | Wt 160.8 lb

## 2018-01-08 DIAGNOSIS — Z975 Presence of (intrauterine) contraceptive device: Principal | ICD-10-CM

## 2018-01-08 DIAGNOSIS — N921 Excessive and frequent menstruation with irregular cycle: Secondary | ICD-10-CM

## 2018-01-08 DIAGNOSIS — Z3046 Encounter for surveillance of implantable subdermal contraceptive: Secondary | ICD-10-CM | POA: Diagnosis not present

## 2018-01-08 NOTE — Progress Notes (Signed)
Pt refused to completed PHQ9 and GAD 7 form

## 2018-01-08 NOTE — Progress Notes (Signed)
Patient ID: Jasmine Ritter, female   DOB: 1985/04/10, 32 y.o.   MRN: [id number]  Jasmine Durandmanda M Cooner is a 32 y.o. [id number] who is here today for Nexplanon removal. She states that she has been bleeding every day since insertion. She reports that she is using approx 5 pads per day, and is concerned that she is "bleeding to death". Patient reports that she has discussed this with friends online, and they have all told her that she needs to have the Nexplanon taken out. Patient states that she trusts her friend more than anyone here. Offered patient options to see if bleeding can be reduced since nexplanon was placed 20 days ago. She refuses to discuss options, and would like it removed. She is not interested in any birth control at this time. She is bottle feeding and will use condoms.   GYNECOLOGY OFFICE PROCEDURE NOTE  Jasmine Durandmanda M Chavana is a 32 y.o. [phone] here for Nexplanon removal.  Last pap smear was on 05/02/17 and was normal.    Nexplanon Removal Patient identified, informed consent performed, consent signed.   Appropriate time out taken. Nexplanon site identified.  Area prepped in usual sterile fashon. 3 ml of 1% lidocaine with epid was used to anesthetize the area at the distal end of the implant. A small stab incision was made right beside the implant on the distal portion.  The Nexplanon rod was grasped using hemostats and removed without difficulty.  There was minimal blood loss. There were no complications.   A pressure bandage was applied to reduce any bruising.  The patient tolerated the procedure well and was given post procedure instructions.  Patient is planning to use condoms for contraception.    Thressa ShellerHeather Hogan DNP, CNM  01/08/18  3:18 PM

## 2018-01-08 NOTE — Patient Instructions (Signed)
Nexplanon Instructions After Insertion/Removal   Keep bandage clean and dry for 24 hours   May use ice/Tylenol/Ibuprofen for soreness or pain   If you develop fever, drainage or increased warmth from incision site-contact office immediately   

## 2019-01-18 NOTE — L&D Delivery Note (Signed)
Delivery Note At 7:03 PM a viable and healthy female was delivered via Vaginal, Spontaneous (Presentation: Left Occiput Anterior).  APGAR: 9, 9; weight pending.   Placenta status: Spontaneous, Intact.  Cord: 3 vessels with the following complications: None.  Cord pH: NA  Anesthesia: Epidural Episiotomy: NA Lacerations: 2nd degree Suture Repair: 3.0 vicryl rapide Est. Blood Loss (mL): 300  Mom to postpartum.  Baby to Couplet care / Skin to Skin. Placenta to: LD Feeding: Bottle Circ: No Contraception: Vasectomy. Plans abstinence until then.  Dorathy Kinsman 09/15/2019, 7:48 PM

## 2019-02-06 ENCOUNTER — Ambulatory Visit (INDEPENDENT_AMBULATORY_CARE_PROVIDER_SITE_OTHER): Payer: Medicaid Other | Admitting: Lactation Services

## 2019-02-06 ENCOUNTER — Other Ambulatory Visit: Payer: Self-pay

## 2019-02-06 ENCOUNTER — Encounter: Payer: Self-pay | Admitting: Medical

## 2019-02-06 VITALS — Ht 63.0 in | Wt 182.1 lb

## 2019-02-06 DIAGNOSIS — Z3200 Encounter for pregnancy test, result unknown: Secondary | ICD-10-CM

## 2019-02-06 LAB — POCT PREGNANCY, URINE: Preg Test, Ur: POSITIVE — AB

## 2019-02-06 MED ORDER — PROMETHAZINE HCL 25 MG PO TABS: 25.0000 mg | ORAL_TABLET | Freq: Four times a day (QID) | ORAL | 0 refills | Status: DC | PRN

## 2019-02-06 NOTE — Progress Notes (Signed)
+   UPT   Last period 12/07/2018 EDD 09/13/2019  Pt reports nausea and vomiting- Phenergan ordered  Pt is taking PNV, she reports she does not need a new prescription at this time.   Pt given list of OTC meds for OB patients.   Pt with no questions or concerns at this time.   Emergency care and location discussed .

## 2019-02-21 ENCOUNTER — Ambulatory Visit (INDEPENDENT_AMBULATORY_CARE_PROVIDER_SITE_OTHER): Payer: Medicaid Other | Admitting: *Deleted

## 2019-02-21 ENCOUNTER — Other Ambulatory Visit: Payer: Self-pay

## 2019-02-21 DIAGNOSIS — Z3490 Encounter for supervision of normal pregnancy, unspecified, unspecified trimester: Secondary | ICD-10-CM | POA: Insufficient documentation

## 2019-02-21 DIAGNOSIS — O99210 Obesity complicating pregnancy, unspecified trimester: Secondary | ICD-10-CM

## 2019-02-21 NOTE — Progress Notes (Signed)
I connected with  Jasmine Ritter on 02/21/19 at  8:30 AM EST by telephone and verified that I am speaking with the correct person using two identifiers.   I discussed the limitations, risks, security and privacy concerns of performing an evaluation and management service by telephone and the availability of in person appointments. I also discussed with the patient that there may be a patient responsible charge related to this service. The patient expressed understanding and agreed to proceed.  Explained I am completing her New OB Intake today. We discussed Her EDD and that it is based on  sure LMP . I reviewed her allergies, meds, OB History, Medical /Surgical history, and appropriate screenings. She confirms she is using the phenergan but it is not helping anymore. I informed her of options per our protocol and she chose to try Unisom/ Vitamin B 6 . I reviewed dosage instructions per our protocol. I  informed her of [facility] services.  I explained I will send her the [employer] app- app sent to her while on phone- but she did not receive it. We discussed this may be due to previous pregnancy and had babyscripts and that I will contact [employer] to resend.  I explained we will have her take her blood pressure weekly and enter into [employer]. She confirms she still has her blood pressure  Cuff from previous pregnancy and it is working. Explained she will have some visits in office and some virtually. She already has MyChart and prefers to use on computer instead of app. I reviewed her new ob  appointment date/ time with her , our location and to wear mask, no visitors. She asked if she could change appointment to a female provider. Registrar changed to female provider and I explained we will try to honor her request , but can never guarantee this.  I explained she will have a pelvic exam, ob bloodwork, hemoglobin a1C, cbg , genetic testing if desired,- she does want a panorama. I  Informed her I will schedule an  [place] at 19 weeks - currently MFM is unavailable and  the appointment will show in MyChart later today. She voices understanding.   Zayonna Ayuso,RN 02/21/2019  8:30 AM

## 2019-02-21 NOTE — Addendum Note (Signed)
Addended byRaynald Blend L on: 02/21/2019 12:12 PM   Modules accepted: Orders

## 2019-02-21 NOTE — Patient Instructions (Signed)

## 2019-02-22 NOTE — Progress Notes (Signed)
Patient seen and assessed by nursing staff during this encounter. I have reviewed the chart and agree with the documentation and plan.  Thressa Sheller DNP, CNM  02/22/19  8:00 AM

## 2019-03-11 ENCOUNTER — Ambulatory Visit (INDEPENDENT_AMBULATORY_CARE_PROVIDER_SITE_OTHER): Payer: Medicaid Other | Admitting: Advanced Practice Midwife

## 2019-03-11 ENCOUNTER — Other Ambulatory Visit: Payer: Self-pay

## 2019-03-11 ENCOUNTER — Encounter: Payer: Self-pay | Admitting: Advanced Practice Midwife

## 2019-03-11 ENCOUNTER — Encounter: Payer: Medicaid Other | Admitting: Obstetrics & Gynecology

## 2019-03-11 DIAGNOSIS — Z3491 Encounter for supervision of normal pregnancy, unspecified, first trimester: Secondary | ICD-10-CM

## 2019-03-11 DIAGNOSIS — Z3492 Encounter for supervision of normal pregnancy, unspecified, second trimester: Secondary | ICD-10-CM

## 2019-03-11 DIAGNOSIS — Z3A13 13 weeks gestation of pregnancy: Secondary | ICD-10-CM

## 2019-03-11 MED ORDER — ONDANSETRON 8 MG PO TBDP: 8.0000 mg | ORAL_TABLET | Freq: Three times a day (TID) | ORAL | 0 refills | Status: DC | PRN

## 2019-03-11 NOTE — Progress Notes (Signed)
Subjective:   Jasmine Ritter is a 34 y.o. [id number] at [redacted]w[redacted]d by LMP being seen today for her first obstetrical visit.  Her obstetrical history is significant for large for gestational age. Patient unsure about her  intent to breast feed. Pregnancy history fully reviewed.  Patient reports nausea and vomiting. Has tried phenergan and unisom/b6. Neither of these are helping.   HISTORY: OB History  Gravida Para Term Preterm AB Living  4 3 3  0 0 4  SAB TAB Ectopic Multiple Live Births  0 0 0 1 4    # Outcome Date GA Lbr Len/2nd Weight Sex Delivery Anes PTL Lv  4 Current           3 Term 11/04/17 [redacted]w[redacted]d / 00:07 8 lb 14.2 oz (4.031 kg) M Vag-Spont EPI, Local  LIV     Birth Comments: anemia, back pain, shoulder dystocia     Complications: Shoulder dystocia, delivered     Name: Giorgio,BOY Atiyana     Apgar1: 7  Apgar5: 9  2A Term 2015 [redacted]w[redacted]d  7 lb (3.175 kg)  Vag-Spont None  LIV     Birth Comments: anemia     Complications: Surrogate pregnancy  2B Term 2015 [redacted]w[redacted]d    Vag-Spont   LIV     Complications: Surrogate pregnancy  1 Term 10/18/08 [redacted]w[redacted]d  7 lb 5 oz (3.317 kg) F Vag-Spont Other N LIV     Birth Comments: wnl    Last pap smear was done 04/2017 and was normal  Past Medical History:  Diagnosis Date  . ADHD   . ADHD   . Anemia    Past Surgical History:  Procedure Laterality Date  . NO PAST SURGERIES     Family History  Problem Relation Age of Onset  . Cancer Mother   . Breast cancer Mother   . Diabetes Paternal Grandfather   . ADD / ADHD Father    Social History   Tobacco Use  . Smoking status: Never Smoker  . Smokeless tobacco: Never Used  Substance Use Topics  . Alcohol use: Not Currently    Comment: socially  . Drug use: No   No Known Allergies Current Outpatient Medications on File Prior to Visit  Medication Sig Dispense Refill  . doxylamine, Sleep, (UNISOM) 25 MG tablet Take 25 mg by mouth at bedtime as needed.    . Prenatal Vit-Fe Fumarate-FA (PRENATAL VITAMINS  PO) Take 1 tablet by mouth daily.    . promethazine (PHENERGAN) 25 MG tablet Take 1 tablet (25 mg total) by mouth every 6 (six) hours as needed for nausea or vomiting. 30 tablet 0  . pyridOXINE (VITAMIN B-6) 25 MG tablet Take 25 mg by mouth daily.    . ferrous sulfate 325 (65 FE) MG EC tablet Take 325 mg by mouth daily.    . Prenatal Vit-Fe Fumarate-FA (MULTIVITAMIN-PRENATAL) 27-0.8 MG TABS tablet Take 1 tablet by mouth daily at 12 noon.     No current facility-administered medications on file prior to visit.    Review of Systems Pertinent items noted in HPI and remainder of comprehensive ROS otherwise negative.  Exam   Vitals:   03/11/19 0841  BP: 133/76  Pulse: 98  Weight: 177 lb 9.6 oz (80.6 kg)      Physical Exam  Constitutional: She is oriented to person, place, and time and well-developed, well-nourished, and in no distress. No distress.  HENT:  Head: Normocephalic.  Cardiovascular: Normal rate.  Pulmonary/Chest: Effort normal.  Abdominal: Soft.  There is no abdominal tenderness. There is no rebound.  Neurological: She is alert and oriented to person, place, and time.  Skin: Skin is warm and dry.  Psychiatric: Affect normal.  Nursing note and vitals reviewed.   Pt informed that the ultrasound is considered a limited OB ultrasound and is not intended to be a complete ultrasound exam.  Patient also informed that the ultrasound is not being completed with the intent of assessing for fetal or placental anomalies or any pelvic abnormalities.  Explained that the purpose of today's ultrasound is to assess for  viability.  Patient acknowledges the purpose of the exam and the limitations of the study.    +cardiac activity   Assessment:   Pregnancy: [id number] Patient Active Problem List   Diagnosis Date Noted  . Supervision of low-risk pregnancy 02/21/2019  . Obesity in pregnancy, antepartum 02/21/2019  . Nexplanon insertion 12/19/2017  . Shoulder dystocia, delivered  11/04/2017  . Severe major depression without psychotic features (HCC) 02/24/2015     Plan:  1. Encounter for supervision of low-risk pregnancy in second trimester - routine care   Initial labs drawn. Continue prenatal vitamins. Genetic Screening discussed, AFP and NIPS: requested. Ultrasound discussed; fetal anatomic survey: requested. Problem list reviewed and updated. The nature of  - [facility] with multiple MDs and other Advanced Practice Providers was explained to patient; also emphasized that residents, students are part of our team. Routine obstetric precautions reviewed. 50% of 45 min visit spent in counseling and coordination of care. Return in about 4 weeks (around 04/08/2019) for virtual visit .   Thressa Sheller DNP, CNM  03/11/19  9:15 AM

## 2019-03-11 NOTE — Progress Notes (Signed)
in

## 2019-03-12 LAB — OBSTETRIC PANEL, INCLUDING HIV
Antibody Screen: NEGATIVE
Basophils Absolute: 0 10*3/uL (ref 0.0–0.2)
Basos: 0 %
EOS (ABSOLUTE): 0.1 10*3/uL (ref 0.0–0.4)
Eos: 1 %
HIV Screen 4th Generation wRfx: NONREACTIVE
Hematocrit: 34.2 % (ref 34.0–46.6)
Hemoglobin: 11.8 g/dL (ref 11.1–15.9)
Hepatitis B Surface Ag: NEGATIVE
Immature Grans (Abs): 0 10*3/uL (ref 0.0–0.1)
Immature Granulocytes: 0 %
Lymphocytes Absolute: 0.9 10*3/uL (ref 0.7–3.1)
Lymphs: 16 %
MCH: 30.2 pg (ref 26.6–33.0)
MCHC: 34.5 g/dL (ref 31.5–35.7)
MCV: 88 fL (ref 79–97)
Monocytes Absolute: 0.3 10*3/uL (ref 0.1–0.9)
Monocytes: 5 %
Neutrophils Absolute: 4.7 10*3/uL (ref 1.4–7.0)
Neutrophils: 78 %
Platelets: 167 10*3/uL (ref 150–450)
RBC: 3.91 x10E6/uL (ref 3.77–5.28)
RDW: 12.7 % (ref 11.7–15.4)
RPR Ser Ql: NONREACTIVE
Rh Factor: POSITIVE
Rubella Antibodies, IGG: 1.86 index (ref >0.99)
WBC: 6 10*3/uL (ref 3.4–10.8)

## 2019-03-13 LAB — URINE CULTURE, OB REFLEX

## 2019-03-13 LAB — CULTURE, OB URINE

## 2019-03-25 ENCOUNTER — Encounter: Payer: Self-pay | Admitting: *Deleted

## 2019-03-27 ENCOUNTER — Encounter: Payer: Self-pay | Admitting: General Practice

## 2019-04-08 ENCOUNTER — Encounter: Payer: Self-pay | Admitting: Student

## 2019-04-08 ENCOUNTER — Other Ambulatory Visit: Payer: Self-pay

## 2019-04-08 ENCOUNTER — Telehealth (INDEPENDENT_AMBULATORY_CARE_PROVIDER_SITE_OTHER): Payer: Medicaid Other | Admitting: Student

## 2019-04-08 DIAGNOSIS — Z3492 Encounter for supervision of normal pregnancy, unspecified, second trimester: Secondary | ICD-10-CM

## 2019-04-08 NOTE — Progress Notes (Signed)
I connected with@ on 04/08/19 at 10:55 AM EDT by: mychart video and verified that I am speaking with the correct person using two identifiers.  Patient is located at home and provider is located at [facility].     The purpose of this virtual visit is to provide medical care while limiting exposure to the novel coronavirus. I discussed the limitations, risks, security and privacy concerns of performing an evaluation and management service by mychart video and the availability of in person appointments. I also discussed with the patient that there may be a patient responsible charge related to this service. By engaging in this virtual visit, you consent to the provision of healthcare.  Additionally, you authorize for your insurance to be billed for the services provided during this visit.  The patient expressed understanding and agreed to proceed.  The following staff members participated in the virtual visit:  Corinda Gubler CMA    PRENATAL VISIT NOTE  Subjective:  Jasmine Ritter is a 34 y.o. [id number] at [redacted]w[redacted]d  for phone visit for ongoing prenatal care.  She is currently monitored for the following issues for this low-risk pregnancy and has Severe major depression without psychotic features (HCC); Shoulder dystocia, delivered; Supervision of low-risk pregnancy; and Obesity in pregnancy, antepartum on their problem list.  Patient reports no complaints.  Contractions: Not present. Vag. Bleeding: None.  Movement: Absent. Denies leaking of fluid.   The following portions of the patient's history were reviewed and updated as appropriate: allergies, current medications, past family history, past medical history, past social history, past surgical history and problem list.   Objective:  There were no vitals filed for this visit. Self-Obtained  Fetal Status:     Movement: Absent     Assessment and Plan:  Pregnancy: [id number] at [redacted]w[redacted]d 1. Encounter for supervision of low-risk pregnancy in second trimester -doing  well -unable to check BP today  Preterm labor symptoms and general obstetric precautions including but not limited to vaginal bleeding, contractions, leaking of fluid and fetal movement were reviewed in detail with the patient.  Return in about 4 weeks (around 05/06/2019) for Routine OB, virtual.  Future Appointments  Date Time Provider Department Center  04/22/2019  1:00 PM WH-MFC [place] 3 WH-MFCUS MFC-US  04/22/2019  2:10 PM [facility]OC[facility]OCA LAB [facility]OC-[facility]A WOC  05/06/2019 10:15 AM Marvetta Gibbons, Brand Males, NP [facility]OC-WOCA WOC  06/03/2019  8:15 AM Nugent, Odie Sera, NP WOC-WOCA WOC     Time spent on virtual visit: 5 minutes  Judeth Horn, NP

## 2019-04-08 NOTE — Progress Notes (Signed)
Asked pt to take BP & sge stated not feeling well to take it right now, asked if she can take BP later & record in BRx. Patient replied that if she can get to it she will.

## 2019-04-08 NOTE — Patient Instructions (Signed)

## 2019-04-18 ENCOUNTER — Other Ambulatory Visit: Payer: Self-pay | Admitting: General Practice

## 2019-04-18 DIAGNOSIS — Z3492 Encounter for supervision of normal pregnancy, unspecified, second trimester: Secondary | ICD-10-CM

## 2019-04-22 ENCOUNTER — Other Ambulatory Visit: Payer: Medicaid Other

## 2019-04-22 ENCOUNTER — Other Ambulatory Visit (HOSPITAL_COMMUNITY): Payer: Self-pay | Admitting: *Deleted

## 2019-04-22 ENCOUNTER — Ambulatory Visit (HOSPITAL_COMMUNITY)
Admission: RE | Admit: 2019-04-22 | Discharge: 2019-04-22 | Disposition: A | Discharge status: Home / Self Care | Payer: Medicaid Other | Source: Ambulatory Visit | Attending: Obstetrics and Gynecology | Admitting: Obstetrics and Gynecology

## 2019-04-22 ENCOUNTER — Other Ambulatory Visit: Payer: Self-pay

## 2019-04-22 DIAGNOSIS — O99340 Other mental disorders complicating pregnancy, unspecified trimester: Secondary | ICD-10-CM | POA: Diagnosis not present

## 2019-04-22 DIAGNOSIS — Z3490 Encounter for supervision of normal pregnancy, unspecified, unspecified trimester: Secondary | ICD-10-CM | POA: Diagnosis present

## 2019-04-22 DIAGNOSIS — O26842 Uterine size-date discrepancy, second trimester: Secondary | ICD-10-CM

## 2019-04-22 DIAGNOSIS — O99210 Obesity complicating pregnancy, unspecified trimester: Secondary | ICD-10-CM | POA: Diagnosis present

## 2019-04-22 DIAGNOSIS — Z3A18 18 weeks gestation of pregnancy: Secondary | ICD-10-CM

## 2019-04-22 DIAGNOSIS — O99212 Obesity complicating pregnancy, second trimester: Secondary | ICD-10-CM

## 2019-04-22 DIAGNOSIS — Z3492 Encounter for supervision of normal pregnancy, unspecified, second trimester: Secondary | ICD-10-CM

## 2019-04-22 DIAGNOSIS — Z363 Encounter for antenatal screening for malformations: Secondary | ICD-10-CM | POA: Diagnosis not present

## 2019-04-24 LAB — AFP, SERUM, OPEN SPINA BIFIDA
AFP MoM: 0.98
AFP Value: 40.1 ng/mL
Gest. Age on Collection Date: 18.2 weeks
Maternal Age At EDD: 31.7 yr
OSBR Risk 1 IN: 10000
Test Results:: NEGATIVE
Weight: 180 [lb_av]

## 2019-05-06 ENCOUNTER — Other Ambulatory Visit: Payer: Self-pay

## 2019-05-06 ENCOUNTER — Telehealth: Payer: Self-pay | Admitting: Family Medicine

## 2019-05-06 ENCOUNTER — Telehealth (INDEPENDENT_AMBULATORY_CARE_PROVIDER_SITE_OTHER): Payer: Medicaid Other | Admitting: Nurse Practitioner

## 2019-05-06 DIAGNOSIS — Z5329 Procedure and treatment not carried out because of patient's decision for other reasons: Secondary | ICD-10-CM

## 2019-05-06 DIAGNOSIS — Z91199 Patient's noncompliance with other medical treatment and regimen due to unspecified reason: Secondary | ICD-10-CM

## 2019-05-06 NOTE — Progress Notes (Signed)
Pt refused Virtual visit today only wants to do face to face visits in the future.  Pam Monika Chestang,CMA [facility] 9:57A

## 2019-05-06 NOTE — Telephone Encounter (Signed)
Received a "IM" message from [facility] (CMA) that patient refused virtual appointments. Called patient and ask could she come in tomorrow morning she said "No" I offered her three other appointments and she said "NO", I them asked her what day can she come, she stated she can only come in the afternoon, I offered her the next afternoon appointment and she took that appointment.

## 2019-05-07 ENCOUNTER — Encounter: Payer: Medicaid Other | Admitting: Family Medicine

## 2019-05-20 ENCOUNTER — Ambulatory Visit: Payer: Medicaid Other

## 2019-05-20 ENCOUNTER — Ambulatory Visit (HOSPITAL_COMMUNITY): Payer: Medicaid Other | Attending: Obstetrics and Gynecology

## 2019-05-21 ENCOUNTER — Encounter: Payer: Medicaid Other | Admitting: Advanced Practice Midwife

## 2019-06-03 ENCOUNTER — Encounter: Payer: Medicaid Other | Admitting: Women's Health

## 2019-06-25 ENCOUNTER — Other Ambulatory Visit: Payer: Self-pay

## 2019-06-25 ENCOUNTER — Ambulatory Visit: Payer: Medicaid Other | Admitting: *Deleted

## 2019-06-25 ENCOUNTER — Ambulatory Visit: Payer: Medicaid Other | Attending: Obstetrics and Gynecology

## 2019-06-25 DIAGNOSIS — O99210 Obesity complicating pregnancy, unspecified trimester: Secondary | ICD-10-CM | POA: Insufficient documentation

## 2019-06-25 DIAGNOSIS — E669 Obesity, unspecified: Secondary | ICD-10-CM

## 2019-06-25 DIAGNOSIS — Z3492 Encounter for supervision of normal pregnancy, unspecified, second trimester: Secondary | ICD-10-CM | POA: Insufficient documentation

## 2019-06-25 DIAGNOSIS — O99212 Obesity complicating pregnancy, second trimester: Secondary | ICD-10-CM | POA: Diagnosis not present

## 2019-06-25 DIAGNOSIS — O26842 Uterine size-date discrepancy, second trimester: Secondary | ICD-10-CM | POA: Diagnosis not present

## 2019-06-25 DIAGNOSIS — Z3A27 27 weeks gestation of pregnancy: Secondary | ICD-10-CM

## 2019-06-25 DIAGNOSIS — F99 Mental disorder, not otherwise specified: Secondary | ICD-10-CM

## 2019-06-25 DIAGNOSIS — Z363 Encounter for antenatal screening for malformations: Secondary | ICD-10-CM

## 2019-06-25 DIAGNOSIS — O99342 Other mental disorders complicating pregnancy, second trimester: Secondary | ICD-10-CM

## 2019-08-29 ENCOUNTER — Ambulatory Visit (INDEPENDENT_AMBULATORY_CARE_PROVIDER_SITE_OTHER): Payer: Medicaid Other | Admitting: Family Medicine

## 2019-08-29 ENCOUNTER — Other Ambulatory Visit (HOSPITAL_COMMUNITY)
Admission: RE | Admit: 2019-08-29 | Discharge: 2019-08-29 | Disposition: A | Discharge status: Home / Self Care | Payer: Medicaid Other | Source: Ambulatory Visit | Attending: Family Medicine | Admitting: Family Medicine

## 2019-08-29 ENCOUNTER — Other Ambulatory Visit: Payer: Self-pay

## 2019-08-29 VITALS — BP 109/69 | HR 94 | Wt 195.3 lb

## 2019-08-29 DIAGNOSIS — Z3493 Encounter for supervision of normal pregnancy, unspecified, third trimester: Secondary | ICD-10-CM | POA: Diagnosis not present

## 2019-08-29 NOTE — Progress Notes (Signed)
   PRENATAL VISIT NOTE  Subjective:  Jasmine Ritter is a 34 y.o. [phone] at [redacted]w[redacted]d being seen today for ongoing prenatal care.  She is currently monitored for the following issues for this high-risk pregnancy and has Severe major depression without psychotic features (HCC); Shoulder dystocia, delivered; Supervision of low-risk pregnancy; and Obesity in pregnancy, antepartum on their problem list.  Patient reports no complaints.  Contractions: Irregular. Vag. Bleeding: None.  Movement: Present. Denies leaking of fluid.   The following portions of the patient's history were reviewed and updated as appropriate: allergies, current medications, past family history, past medical history, past social history, past surgical history and problem list.   Objective:   Vitals:   08/29/19 1443  BP: 109/69  Pulse: 94  Weight: 195 lb 4.8 oz (88.6 kg)    Fetal Status: Fetal Heart Rate (bpm): 138 Fundal Height: 34 cm Movement: Present  Presentation: Vertex  General:  Alert, oriented and cooperative. Patient is in no acute distress.  Skin: Skin is warm and dry. No rash noted.   Cardiovascular: Normal heart rate noted  Respiratory: Normal respiratory effort, no problems with respiration noted  Abdomen: Soft, gravid, appropriate for gestational age.  Pain/Pressure: Present     Pelvic: Cervical exam deferred        Extremities: Normal range of motion.  Edema: Trace  Mental Status: Normal mood and affect. Normal behavior. Normal judgment and thought content.   Assessment and Plan:  Pregnancy: [id number] at [redacted]w[redacted]d 1. Encounter for supervision of low-risk pregnancy in third trimester Cultures today - Culture, beta strep (group b only) - GC/Chlamydia probe amp ([place])not at [facility]  2. Shoulder dystocia, delivered To discuss based on EFW. Discussed primary C-section or IOL at 39 wks. Last baby almost 9 lbs at 40.5 wks with SD. This baby was at 21% in June. - [place] MFM OB FOLLOW UP; Future  Preterm labor  symptoms and general obstetric precautions including but not limited to vaginal bleeding, contractions, leaking of fluid and fetal movement were reviewed in detail with the patient. Please refer to After Visit Summary for other counseling recommendations.   Return in 1 week (on 09/05/2019) for in person, [facility] female provider only.  Future Appointments  Date Time Provider Department Center  09/10/2019  1:40 PM Nugent, Odie Sera, NP [facility]    Reva Bores, MD

## 2019-08-29 NOTE — Patient Instructions (Signed)

## 2019-08-30 LAB — GC/CHLAMYDIA PROBE AMP (~~LOC~~) NOT AT ARMC
Chlamydia: NEGATIVE
Comment: NEGATIVE
Comment: NORMAL
Neisseria Gonorrhea: NEGATIVE

## 2019-09-02 ENCOUNTER — Encounter: Payer: Self-pay | Admitting: Family Medicine

## 2019-09-02 DIAGNOSIS — O99820 Streptococcus B carrier state complicating pregnancy: Secondary | ICD-10-CM | POA: Insufficient documentation

## 2019-09-02 LAB — CULTURE, BETA STREP (GROUP B ONLY): Strep Gp B Culture: POSITIVE — AB

## 2019-09-09 NOTE — Progress Notes (Signed)
Subjective:  Jasmine Ritter is a 34 y.o. [phone] at [redacted]w[redacted]d being seen today for ongoing prenatal care.  She is currently monitored for the following issues for this low-risk pregnancy and has Shoulder dystocia, delivered; Supervision of low-risk pregnancy; Obesity in pregnancy, antepartum; and Group B Streptococcus carrier, +RV culture, currently pregnant on their problem list.   Patient reports pelvic girdle pain - pt reports pelvic girdle pain that has been going on for a month or two. Pt reports she is unable to get up off of the couch/bed without assistance from her husband. Patient reports she does go to work and is planning to go this afternoon. Patient does not have a pregnancy support belt at home. Patient has not been referred to pelvic physical therapy. Patient reports "intense pressure" when lying down and "like her bones are being pulled apart. Patient also reports light green/yellow tinged vaginal discharged. Contractions: Irritability. Vag. Bleeding: None.  Movement: Present. Denies leaking of fluid.   The following portions of the patient's history were reviewed and updated as appropriate: allergies, current medications, past family history, past medical history, past social history, past surgical history and problem list. Problem list updated.  Objective:   Vitals:   09/10/19 1339  BP: 123/70  Pulse: (!) 101  Weight: 199 lb (90.3 kg)    Fetal Status: Fetal Heart Rate (bpm): 140 Fundal Height: 37 cm Movement: Present     General:  Alert, oriented and cooperative. Patient is in no acute distress.  Skin: Skin is warm and dry. No rash noted.   Cardiovascular: Normal heart rate noted  Respiratory: Normal respiratory effort, no problems with respiration noted  Abdomen: Soft, gravid, appropriate for gestational age. Pain/Pressure: Present     Pelvic: Vag. Bleeding: None Vag D/C Character: Yellow   Cervical exam deferred        Extremities: Normal range of motion.  Edema: Trace   Mental Status: Normal mood and affect. Normal behavior. Normal judgment and thought content.   Urinalysis:      Assessment and Plan:  Pregnancy: [id number] at [redacted]w[redacted]d  1. Encounter for supervision of low-risk pregnancy in third trimester -pt confirms she did not have a glucose test because of the office move and because of family vacation and reports no one offered her a test after this time. -discussed Tdap in pregnancy, pt accepts today with understanding that this vaccine will likely only offer her protection and give little, if any protection to the baby, especially in the event of early delivery -POCT CBG 134 (pt reports eating only graham crackers today) -A1C today -FH 37 -no glucose testing on file, pt seen 03/2019 and then not again until 08/2019 at [redacted]w[redacted]d  2. Obesity in pregnancy, antepartum  3. Group B Streptococcus carrier, +RV culture, currently pregnant -will treat in labor, pt aware  4. Shoulder dystocia, delivered -pt reports she cancelled her growth [place] today d/t childcare issues, will attempt to reschedule this week -pt was scheduled to have growth [place] today, to discuss IOL/primary C/S at [facility] with MD based on results  5. Vaginal discharge during pregnancy in third trimester -Aptima swab performed  6. Pelvic pain affecting pregnancy in third trimester, antepartum -pt wishes to discuss early delivery with MD for pelvic girdle pain -pt does not have a pregnancy support belt and declines one at this time -pt also never referred to pelvic PT, referral pending discussion with MD regarding delivery at 39 weeks    Term labor symptoms and general obstetric precautions including  but not limited to vaginal bleeding, contractions, leaking of fluid and fetal movement were reviewed in detail with the patient. I discussed the assessment and treatment plan with the patient. The patient was provided an opportunity to ask questions and all were answered. The patient agreed with the  plan and demonstrated an understanding of the instructions. The patient was advised to call back or seek an in-person office evaluation/go to [facility] at [facility] for any urgent or concerning symptoms. Please refer to After Visit Summary for other counseling recommendations.  Return in about 1 day (around 09/11/2019) for in-person HOB/MD ONLY to discuss pelvic pain and early delivery, needs to reschedule growth [place] ASAP.   Colleen Donahoe, Odie Sera, NP

## 2019-09-10 ENCOUNTER — Other Ambulatory Visit: Payer: Self-pay

## 2019-09-10 ENCOUNTER — Ambulatory Visit: Payer: Medicaid Other

## 2019-09-10 ENCOUNTER — Other Ambulatory Visit
Admission: RE | Admit: 2019-09-10 | Discharge: 2019-09-10 | Disposition: A | Discharge status: Home / Self Care | Payer: Medicaid Other | Source: Ambulatory Visit | Attending: Women's Health | Admitting: Women's Health

## 2019-09-10 ENCOUNTER — Ambulatory Visit (INDEPENDENT_AMBULATORY_CARE_PROVIDER_SITE_OTHER): Payer: Medicaid Other | Admitting: Women's Health

## 2019-09-10 VITALS — BP 123/70 | HR 101 | Wt 199.0 lb

## 2019-09-10 DIAGNOSIS — Z23 Encounter for immunization: Secondary | ICD-10-CM | POA: Diagnosis not present

## 2019-09-10 DIAGNOSIS — N898 Other specified noninflammatory disorders of vagina: Secondary | ICD-10-CM

## 2019-09-10 DIAGNOSIS — O26893 Other specified pregnancy related conditions, third trimester: Secondary | ICD-10-CM

## 2019-09-10 DIAGNOSIS — O99820 Streptococcus B carrier state complicating pregnancy: Secondary | ICD-10-CM | POA: Diagnosis not present

## 2019-09-10 DIAGNOSIS — Z3493 Encounter for supervision of normal pregnancy, unspecified, third trimester: Secondary | ICD-10-CM

## 2019-09-10 DIAGNOSIS — R102 Pelvic and perineal pain: Secondary | ICD-10-CM

## 2019-09-10 DIAGNOSIS — O99210 Obesity complicating pregnancy, unspecified trimester: Secondary | ICD-10-CM

## 2019-09-10 LAB — GLUCOSE, CAPILLARY: Glucose-Capillary: 134 mg/dL — ABNORMAL HIGH (ref 70–99)

## 2019-09-10 NOTE — Patient Instructions (Addendum)
Maternity Assessment Unit (MAU)  The Maternity Assessment Unit (MAU) is located at the [facility]. The address is: [address] [place], [place], [place] [address]. Please see map below for additional directions.    The Maternity Assessment Unit is designed to help you during your pregnancy, and for up to 6 weeks after delivery, with any pregnancy- or postpartum-related emergencies, if you think you are in labor, or if your water has broken. For example, if you experience nausea and vomiting, vaginal bleeding, severe abdominal or pelvic pain, elevated blood pressure or other problems related to your pregnancy or postpartum time, please come to the Maternity Assessment Unit for assistance.         https://www.cdc.gov/vaccines/hcp/vis/vis-statements/tdap.pdf">  Tdap (Tetanus, Diphtheria, Pertussis) Vaccine: What You Need to Know 1. Why get vaccinated? Tdap vaccine can prevent tetanus, diphtheria, and pertussis. Diphtheria and pertussis spread from person to person. Tetanus enters the body through cuts or wounds.  TETANUS (T) causes painful stiffening of the muscles. Tetanus can lead to serious health problems, including being unable to open the mouth, having trouble swallowing and breathing, or death.  DIPHTHERIA (D) can lead to difficulty breathing, heart failure, paralysis, or death.  PERTUSSIS (aP), also known as "whooping cough," can cause uncontrollable, violent coughing which makes it hard to breathe, eat, or drink. Pertussis can be extremely serious in babies and young children, causing pneumonia, convulsions, brain damage, or death. In teens and adults, it can cause weight loss, loss of bladder control, passing out, and rib fractures from severe coughing. 2. Tdap vaccine Tdap is only for children 7 years and older, adolescents, and adults.  Adolescents should receive a single dose of Tdap, preferably at age 46 or 12  years. Pregnant women should get a dose of Tdap during every pregnancy, to protect the newborn from pertussis. Infants are most at risk for severe, life-threatening complications from pertussis. Adults who have never received Tdap should get a dose of Tdap. Also, adults should receive a booster dose every 10 years, or earlier in the case of a severe and dirty wound or burn. Booster doses can be either Tdap or Td (a different vaccine that protects against tetanus and diphtheria but not pertussis). Tdap may be given at the same time as other vaccines. 3. Talk with your health care provider Tell your vaccine provider if the person getting the vaccine:  Has had an allergic reaction after a previous dose of any vaccine that protects against tetanus, diphtheria, or pertussis, or has any severe, life-threatening allergies.  Has had a coma, decreased level of consciousness, or prolonged seizures within 7 days after a previous dose of any pertussis vaccine (DTP, DTaP, or Tdap).  Has seizures or another nervous system problem.  Has ever had Guillain-Barr Syndrome (also called GBS).  Has had severe pain or swelling after a previous dose of any vaccine that protects against tetanus or diphtheria. In some cases, your health care provider may decide to postpone Tdap vaccination to a future visit.  People with minor illnesses, such as a cold, may be vaccinated. People who are moderately or severely ill should usually wait until they recover before getting Tdap vaccine.  Your health care provider can give you more information. 4. Risks of a vaccine reaction  Pain, redness, or swelling where the shot was given, mild fever, headache, feeling tired, and nausea, vomiting, diarrhea, or stomachache sometimes happen after Tdap vaccine. People sometimes faint after medical procedures, including vaccination.  Tell your provider if you feel dizzy or have vision changes or ringing in the ears.  As with any medicine,  there is a very remote chance of a vaccine causing a severe allergic reaction, other serious injury, or death. 5. What if there is a serious problem? An allergic reaction could occur after the vaccinated person leaves the clinic. If you see signs of a severe allergic reaction (hives, swelling of the face and throat, difficulty breathing, a fast heartbeat, dizziness, or weakness), call 9-1-1 and get the person to the nearest hospital. For other signs that concern you, call your health care provider.  Adverse reactions should be reported to the Vaccine Adverse Event Reporting System (VAERS). Your health care provider will usually file this report, or you can do it yourself. Visit the VAERS website at www.vaers.LAgents.nohhs.gov or call 305-475-82921-847-203-6817. VAERS is only for reporting reactions, and VAERS staff do not give medical advice. 6. The National Vaccine Injury Compensation Program The [employer] Vaccine Injury Compensation Program (VICP) is a federal program that was created to compensate people who may have been injured by certain vaccines. Visit the VICP website at SpiritualWord.atwww.hrsa.gov/vaccinecompensation or call 343 523 37991-217-850-2629 to learn about the program and about filing a claim. There is a time limit to file a claim for compensation. 7. How can I learn more?  Ask your health care provider.  Call your local or state health department.  Contact the Centers for Disease Control and Prevention (CDC): ? Call 614-193-95351-929-264-4701 (1-800-CDC-INFO) or ? Visit CDC's website at PicCapture.uywww.cdc.gov/vaccines Vaccine Information Statement Tdap (Tetanus, Diphtheria, Pertussis) Vaccine (04/18/2018) This information is not intended to replace advice given to you by your health care provider. Make sure you discuss any questions you have with your health care provider. Document Revised: 04/27/2018 Document Reviewed: 04/30/2018 Elsevier Patient Education  2020 [employer]        Signs and Symptoms of Labor Labor is your body's natural  process of moving your baby, placenta, and umbilical cord out of your uterus. The process of labor usually starts when your baby is full-term, between 4537 and 40 weeks of pregnancy. How will I know when I am close to going into labor? As your body prepares for labor and the birth of your baby, you may notice the following symptoms in the weeks and days before true labor starts:  Having a strong desire to get your home ready to receive your new baby. This is called nesting. Nesting may be a sign that labor is approaching, and it may occur several weeks before birth. Nesting may involve cleaning and organizing your home.  Passing a small amount of thick, bloody mucus out of your vagina (normal bloody show or losing your mucus plug). This may happen more than a week before labor begins, or it might occur right before labor begins as the opening of the cervix starts to widen (dilate). For some women, the entire mucus plug passes at once. For others, smaller portions of the mucus plug may gradually pass over several days.  Your baby moving (dropping) lower in your pelvis to get into position for birth (lightening). When this happens, you may feel more pressure on your bladder and pelvic bone and less pressure on your ribs. This may make it easier to breathe. It may also cause you to need to urinate more often and have problems with bowel movements.  Having "practice contractions" (Braxton Hicks contractions) that occur at irregular (unevenly spaced) intervals that are more than 10 minutes apart. This is  also called false labor. False labor contractions are common after exercise or sexual activity, and they will stop if you change position, rest, or drink fluids. These contractions are usually mild and do not get stronger over time. They may feel like: ? A backache or back pain. ? Mild cramps, similar to menstrual cramps. ? Tightening or pressure in your abdomen. Other early symptoms that labor may be  starting soon include:  Nausea or loss of appetite.  Diarrhea.  Having a sudden burst of energy, or feeling very tired.  Mood changes.  Having trouble sleeping. How will I know when labor has begun? Signs that true labor has begun may include:  Having contractions that come at regular (evenly spaced) intervals and increase in intensity. This may feel like more intense tightening or pressure in your abdomen that moves to your back. ? Contractions may also feel like rhythmic pain in your upper thighs or back that comes and goes at regular intervals. ? For first-time mothers, this change in intensity of contractions often occurs at a more gradual pace. ? Women who have given birth before may notice a more rapid progression of contraction changes.  Having a feeling of pressure in the vaginal area.  Your water breaking (rupture of membranes). This is when the sac of fluid that surrounds your baby breaks. When this happens, you will notice fluid leaking from your vagina. This may be clear or blood-tinged. Labor usually starts within 24 hours of your water breaking, but it may take longer to begin. ? Some women notice this as a gush of fluid. ? Others notice that their underwear repeatedly becomes damp. Follow these instructions at home:   When labor starts, or if your water breaks, call your health care provider or nurse care line. Based on your situation, they will determine when you should go in for an exam.  When you are in early labor, you may be able to rest and manage symptoms at home. Some strategies to try at home include: ? Breathing and relaxation techniques. ? Taking a warm bath or shower. ? Listening to music. ? Using a heating pad on the lower back for pain. If you are directed to use heat:  Place a towel between your skin and the heat source.  Leave the heat on for 20-30 minutes.  Remove the heat if your skin turns bright red. This is especially important if you are  unable to feel pain, heat, or cold. You may have a greater risk of getting burned. Get help right away if:  You have painful, regular contractions that are 5 minutes apart or less.  Labor starts before you are [redacted] weeks along in your pregnancy.  You have a fever.  You have a headache that does not go away.  You have bright red blood coming from your vagina.  You do not feel your baby moving.  You have a sudden onset of: ? Severe headache with vision problems. ? Nausea, vomiting, or diarrhea. ? Chest pain or shortness of breath. These symptoms may be an emergency. If your health care provider recommends that you go to the hospital or birth center where you plan to deliver, do not drive yourself. Have someone else drive you, or call emergency services (911 in the [place] Summary  Labor is your body's natural process of moving your baby, placenta, and umbilical cord out of your uterus.  The process of labor usually starts when your baby is full-term, between 16 and 70  weeks of pregnancy.  When labor starts, or if your water breaks, call your health care provider or nurse care line. Based on your situation, they will determine when you should go in for an exam. This information is not intended to replace advice given to you by your health care provider. Make sure you discuss any questions you have with your health care provider. Document Revised: 10/03/2016 Document Reviewed: 06/10/2016 Elsevier Patient Education  2020 [employer].

## 2019-09-11 ENCOUNTER — Ambulatory Visit (INDEPENDENT_AMBULATORY_CARE_PROVIDER_SITE_OTHER): Payer: Medicaid Other | Admitting: Family Medicine

## 2019-09-11 ENCOUNTER — Other Ambulatory Visit: Payer: Self-pay | Admitting: Advanced Practice Midwife

## 2019-09-11 ENCOUNTER — Telehealth (HOSPITAL_COMMUNITY): Payer: Self-pay | Admitting: *Deleted

## 2019-09-11 VITALS — BP 126/71 | HR 106 | Wt 200.6 lb

## 2019-09-11 DIAGNOSIS — O99820 Streptococcus B carrier state complicating pregnancy: Secondary | ICD-10-CM

## 2019-09-11 DIAGNOSIS — Z3493 Encounter for supervision of normal pregnancy, unspecified, third trimester: Secondary | ICD-10-CM

## 2019-09-11 DIAGNOSIS — Z3A38 38 weeks gestation of pregnancy: Secondary | ICD-10-CM

## 2019-09-11 LAB — CERVICOVAGINAL ANCILLARY ONLY
Bacterial Vaginitis (gardnerella): NEGATIVE
Candida Glabrata: NEGATIVE
Candida Vaginitis: NEGATIVE
Chlamydia: NEGATIVE
Comment: NEGATIVE
Comment: NEGATIVE
Comment: NEGATIVE
Comment: NEGATIVE
Comment: NEGATIVE
Comment: NORMAL
Neisseria Gonorrhea: NEGATIVE
Trichomonas: NEGATIVE

## 2019-09-11 LAB — HEMOGLOBIN A1C
Est. average glucose Bld gHb Est-mCnc: 114 mg/dL
Hgb A1c MFr Bld: 5.6 % (ref 4.8–5.6)

## 2019-09-11 NOTE — Patient Instructions (Signed)

## 2019-09-11 NOTE — Progress Notes (Signed)
    PRENATAL VISIT NOTE  Subjective:  Jasmine Ritter is a 34 y.o. [phone] at [redacted]w[redacted]d being seen today for ongoing prenatal care.  She is currently monitored for the following issues for this low-risk pregnancy and has Shoulder dystocia, delivered; Supervision of low-risk pregnancy; Obesity in pregnancy, antepartum; and Group B Streptococcus carrier, +RV culture, currently pregnant on their problem list.  Patient reports no complaints.  Contractions: Irritability. Vag. Bleeding: None.  Movement: Present. Denies leaking of fluid.   The following portions of the patient's history were reviewed and updated as appropriate: allergies, current medications, past family history, past medical history, past social history, past surgical history and problem list.   Objective:   Vitals:   09/11/19 1122  BP: 126/71  Pulse: (!) 106  Weight: 200 lb 9.6 oz (91 kg)    Fetal Status:     Movement: Present     General:  Alert, oriented and cooperative. Patient is in no acute distress.  Skin: Skin is warm and dry. No rash noted.   Cardiovascular: Normal heart rate noted  Respiratory: Normal respiratory effort, no problems with respiration noted  Abdomen: Soft, gravid, appropriate for gestational age.  Pain/Pressure: Present     Pelvic: Cervical exam deferred        Extremities: Normal range of motion.  Edema: Trace  Mental Status: Normal mood and affect. Normal behavior. Normal judgment and thought content.   Assessment and Plan:  Pregnancy: [id number] at [redacted]w[redacted]d 1. Group B Streptococcus carrier, +RV culture, currently pregnant Will need treatment in labor  2. Encounter for supervision of low-risk pregnancy in third trimester  3. History of shoulder dystocia IOL at 39 wks U/s for growth tomorrow. If EFW is close to weight of last baby, will call to offer PCS.  Term labor symptoms and general obstetric precautions including but not limited to vaginal bleeding, contractions, leaking of fluid and fetal  movement were reviewed in detail with the patient. Please refer to After Visit Summary for other counseling recommendations.   Return in 6 weeks (on 10/23/2019) for pp check.  Future Appointments  Date Time Provider Department Center  09/12/2019 12:45 PM [facility] US5 [facility]  09/13/2019  9:45 AM [facility]-SCREENING [facility]SD[facility] None  09/15/2019  9:40 AM MC-LD SCHED ROOM [facility]INDC None    Reva Bores, MD

## 2019-09-11 NOTE — Telephone Encounter (Signed)
Preadmission screen  

## 2019-09-11 NOTE — Progress Notes (Signed)
Called and scheduled IOL for AM on 09/15/2019

## 2019-09-12 ENCOUNTER — Telehealth (HOSPITAL_COMMUNITY): Payer: Self-pay | Admitting: *Deleted

## 2019-09-12 ENCOUNTER — Encounter (HOSPITAL_COMMUNITY): Payer: Self-pay | Admitting: *Deleted

## 2019-09-12 ENCOUNTER — Other Ambulatory Visit: Payer: Self-pay

## 2019-09-12 ENCOUNTER — Ambulatory Visit: Payer: Medicaid Other | Attending: Family Medicine

## 2019-09-12 DIAGNOSIS — O99343 Other mental disorders complicating pregnancy, third trimester: Secondary | ICD-10-CM

## 2019-09-12 DIAGNOSIS — O26843 Uterine size-date discrepancy, third trimester: Secondary | ICD-10-CM | POA: Diagnosis not present

## 2019-09-12 DIAGNOSIS — O99213 Obesity complicating pregnancy, third trimester: Secondary | ICD-10-CM

## 2019-09-12 DIAGNOSIS — Z362 Encounter for other antenatal screening follow-up: Secondary | ICD-10-CM | POA: Diagnosis not present

## 2019-09-12 DIAGNOSIS — E669 Obesity, unspecified: Secondary | ICD-10-CM

## 2019-09-12 DIAGNOSIS — Z3A38 38 weeks gestation of pregnancy: Secondary | ICD-10-CM

## 2019-09-12 DIAGNOSIS — F99 Mental disorder, not otherwise specified: Secondary | ICD-10-CM

## 2019-09-12 NOTE — Telephone Encounter (Signed)
Preadmission screen  

## 2019-09-13 ENCOUNTER — Other Ambulatory Visit (HOSPITAL_COMMUNITY)
Admission: RE | Admit: 2019-09-13 | Discharge: 2019-09-13 | Disposition: A | Discharge status: Home / Self Care | Payer: Medicaid Other | Source: Ambulatory Visit | Attending: Family Medicine | Admitting: Family Medicine

## 2019-09-13 ENCOUNTER — Other Ambulatory Visit (HOSPITAL_COMMUNITY): Payer: Medicaid Other

## 2019-09-13 DIAGNOSIS — Z01812 Encounter for preprocedural laboratory examination: Secondary | ICD-10-CM | POA: Insufficient documentation

## 2019-09-13 DIAGNOSIS — Z20822 Contact with and (suspected) exposure to covid-19: Secondary | ICD-10-CM | POA: Insufficient documentation

## 2019-09-13 LAB — SARS CORONAVIRUS 2 (TAT 6-24 HRS): SARS Coronavirus 2: NEGATIVE

## 2019-09-15 ENCOUNTER — Encounter (HOSPITAL_COMMUNITY): Payer: Self-pay | Admitting: Family Medicine

## 2019-09-15 ENCOUNTER — Other Ambulatory Visit: Payer: Self-pay | Admitting: Family Medicine

## 2019-09-15 ENCOUNTER — Inpatient Hospital Stay (HOSPITAL_COMMUNITY): Payer: Medicaid Other | Admitting: Anesthesiology

## 2019-09-15 ENCOUNTER — Inpatient Hospital Stay (HOSPITAL_COMMUNITY): Payer: Medicaid Other

## 2019-09-15 ENCOUNTER — Inpatient Hospital Stay (HOSPITAL_COMMUNITY)
Admission: AD | Admit: 2019-09-15 | Discharge: 2019-09-16 | DRG: 807 | Disposition: A | Discharge status: Home / Self Care | Payer: Medicaid Other | Attending: Family Medicine | Admitting: Family Medicine

## 2019-09-15 DIAGNOSIS — O99214 Obesity complicating childbirth: Secondary | ICD-10-CM | POA: Diagnosis present

## 2019-09-15 DIAGNOSIS — Z3A39 39 weeks gestation of pregnancy: Secondary | ICD-10-CM

## 2019-09-15 DIAGNOSIS — O26893 Other specified pregnancy related conditions, third trimester: Secondary | ICD-10-CM | POA: Diagnosis present

## 2019-09-15 DIAGNOSIS — O99820 Streptococcus B carrier state complicating pregnancy: Secondary | ICD-10-CM

## 2019-09-15 DIAGNOSIS — Z3490 Encounter for supervision of normal pregnancy, unspecified, unspecified trimester: Secondary | ICD-10-CM

## 2019-09-15 DIAGNOSIS — O99824 Streptococcus B carrier state complicating childbirth: Principal | ICD-10-CM | POA: Diagnosis present

## 2019-09-15 LAB — CBC
HCT: 31.5 % — ABNORMAL LOW (ref 36.0–46.0)
Hemoglobin: 9.8 g/dL — ABNORMAL LOW (ref 12.0–15.0)
MCH: 28.5 pg (ref 26.0–34.0)
MCHC: 31.1 g/dL (ref 30.0–36.0)
MCV: 91.6 fL (ref 80.0–100.0)
Platelets: 164 10*3/uL (ref 150–400)
RBC: 3.44 MIL/uL — ABNORMAL LOW (ref 3.87–5.11)
RDW: 13.7 % (ref 11.5–15.5)
WBC: 6.2 10*3/uL (ref 4.0–10.5)
nRBC: 0 % (ref 0.0–0.2)

## 2019-09-15 LAB — TYPE AND SCREEN
ABO/RH(D): A POS
Antibody Screen: NEGATIVE

## 2019-09-15 LAB — RPR: RPR Ser Ql: NONREACTIVE

## 2019-09-15 MED ORDER — BUPIVACAINE HCL (PF) 0.75 % IJ SOLN
INTRAMUSCULAR | Status: DC | PRN
Start: 2019-09-15 — End: 2019-09-15
  Administered 2019-09-15: 12 mL/h via EPIDURAL

## 2019-09-15 MED ORDER — SENNOSIDES-DOCUSATE SODIUM 8.6-50 MG PO TABS
2.0000 | ORAL_TABLET | ORAL | Status: DC
  Filled 2019-09-15: qty 2

## 2019-09-15 MED ORDER — WITCH HAZEL-GLYCERIN EX PADS: 1.0000 "application " | MEDICATED_PAD | CUTANEOUS | Status: DC | PRN

## 2019-09-15 MED ORDER — MISOPROSTOL 200 MCG PO TABS
ORAL_TABLET | ORAL | Status: AC
  Filled 2019-09-15: qty 4

## 2019-09-15 MED ORDER — LACTATED RINGERS IV SOLN: INTRAVENOUS | Status: DC

## 2019-09-15 MED ORDER — LIDOCAINE HCL (PF) 1 % IJ SOLN
INTRAMUSCULAR | Status: DC | PRN
  Administered 2019-09-15: 6 mL via EPIDURAL

## 2019-09-15 MED ORDER — MISOPROSTOL 200 MCG PO TABS
800.0000 ug | ORAL_TABLET | Freq: Once | ORAL | Status: AC
  Administered 2019-09-15: 800 ug via BUCCAL

## 2019-09-15 MED ORDER — OXYCODONE-ACETAMINOPHEN 5-325 MG PO TABS: 2.0000 | ORAL_TABLET | ORAL | Status: DC | PRN

## 2019-09-15 MED ORDER — TERBUTALINE SULFATE 1 MG/ML IJ SOLN: 0.2500 mg | Freq: Once | INTRAMUSCULAR | Status: DC | PRN

## 2019-09-15 MED ORDER — FENTANYL-BUPIVACAINE-NACL 0.5-0.125-0.9 MG/250ML-% EP SOLN
12.0000 mL/h | EPIDURAL | Status: DC | PRN
  Filled 2019-09-15: qty 250

## 2019-09-15 MED ORDER — DIBUCAINE (PERIANAL) 1 % EX OINT: 1.0000 "application " | TOPICAL_OINTMENT | CUTANEOUS | Status: DC | PRN

## 2019-09-15 MED ORDER — OXYCODONE-ACETAMINOPHEN 5-325 MG PO TABS: 1.0000 | ORAL_TABLET | ORAL | Status: DC | PRN

## 2019-09-15 MED ORDER — ZOLPIDEM TARTRATE 5 MG PO TABS: 5.0000 mg | ORAL_TABLET | Freq: Every evening | ORAL | Status: DC | PRN

## 2019-09-15 MED ORDER — IBUPROFEN 600 MG PO TABS
600.0000 mg | ORAL_TABLET | Freq: Four times a day (QID) | ORAL | Status: DC
  Administered 2019-09-15 – 2019-09-16 (×4): 600 mg via ORAL
  Filled 2019-09-15 (×4): qty 1

## 2019-09-15 MED ORDER — SOD CITRATE-CITRIC ACID 500-334 MG/5ML PO SOLN: 30.0000 mL | ORAL | Status: DC | PRN

## 2019-09-15 MED ORDER — SIMETHICONE 80 MG PO CHEW: 80.0000 mg | CHEWABLE_TABLET | ORAL | Status: DC | PRN

## 2019-09-15 MED ORDER — ONDANSETRON HCL 4 MG/2ML IJ SOLN: 4.0000 mg | Freq: Four times a day (QID) | INTRAMUSCULAR | Status: DC | PRN

## 2019-09-15 MED ORDER — DIPHENHYDRAMINE HCL 50 MG/ML IJ SOLN: 12.5000 mg | INTRAMUSCULAR | Status: DC | PRN

## 2019-09-15 MED ORDER — DIPHENHYDRAMINE HCL 25 MG PO CAPS: 25.0000 mg | ORAL_CAPSULE | Freq: Four times a day (QID) | ORAL | Status: DC | PRN

## 2019-09-15 MED ORDER — PRENATAL MULTIVITAMIN CH
1.0000 | ORAL_TABLET | Freq: Every day | ORAL | Status: DC
  Administered 2019-09-16: 1 via ORAL
  Filled 2019-09-15: qty 1

## 2019-09-15 MED ORDER — SODIUM CHLORIDE 0.9 % IV SOLN
5.0000 10*6.[IU] | Freq: Once | INTRAVENOUS | Status: AC
  Administered 2019-09-15: 5 10*6.[IU] via INTRAVENOUS
  Filled 2019-09-15: qty 5

## 2019-09-15 MED ORDER — BENZOCAINE-MENTHOL 20-0.5 % EX AERO
1.0000 "application " | INHALATION_SPRAY | CUTANEOUS | Status: DC | PRN
  Administered 2019-09-15: 1 via TOPICAL
  Filled 2019-09-15: qty 56

## 2019-09-15 MED ORDER — PENICILLIN G POT IN DEXTROSE 60000 UNIT/ML IV SOLN
3.0000 10*6.[IU] | INTRAVENOUS | Status: DC
  Administered 2019-09-15 (×2): 3 10*6.[IU] via INTRAVENOUS
  Filled 2019-09-15 (×2): qty 50

## 2019-09-15 MED ORDER — FENTANYL-BUPIVACAINE-NACL 0.5-0.125-0.9 MG/250ML-% EP SOLN: 12.0000 mL/h | EPIDURAL | Status: DC | PRN

## 2019-09-15 MED ORDER — DIPHENOXYLATE-ATROPINE 2.5-0.025 MG PO TABS
2.0000 | ORAL_TABLET | Freq: Once | ORAL | Status: AC
  Administered 2019-09-15: 2 via ORAL
  Filled 2019-09-15: qty 2

## 2019-09-15 MED ORDER — LIDOCAINE HCL (PF) 1 % IJ SOLN: 30.0000 mL | INTRAMUSCULAR | Status: DC | PRN

## 2019-09-15 MED ORDER — OXYTOCIN BOLUS FROM INFUSION
333.0000 mL | Freq: Once | INTRAVENOUS | Status: AC
  Administered 2019-09-15: 333 mL via INTRAVENOUS

## 2019-09-15 MED ORDER — COCONUT OIL OIL: 1.0000 "application " | TOPICAL_OIL | Status: DC | PRN

## 2019-09-15 MED ORDER — OXYTOCIN-SODIUM CHLORIDE 30-0.9 UT/500ML-% IV SOLN: 2.5000 [IU]/h | INTRAVENOUS | Status: DC

## 2019-09-15 MED ORDER — LACTATED RINGERS IV SOLN: 500.0000 mL | INTRAVENOUS | Status: DC | PRN

## 2019-09-15 MED ORDER — OXYTOCIN-SODIUM CHLORIDE 30-0.9 UT/500ML-% IV SOLN
1.0000 m[IU]/min | INTRAVENOUS | Status: DC
  Administered 2019-09-15: 2 m[IU]/min via INTRAVENOUS
  Filled 2019-09-15: qty 500

## 2019-09-15 MED ORDER — TETANUS-DIPHTH-ACELL PERTUSSIS 5-2.5-18.5 LF-MCG/0.5 IM SUSP: 0.5000 mL | Freq: Once | INTRAMUSCULAR | Status: DC

## 2019-09-15 MED ORDER — ACETAMINOPHEN 325 MG PO TABS
650.0000 mg | ORAL_TABLET | ORAL | Status: DC | PRN
  Administered 2019-09-16 (×5): 650 mg via ORAL
  Filled 2019-09-15 (×5): qty 2

## 2019-09-15 MED ORDER — ACETAMINOPHEN 325 MG PO TABS: 650.0000 mg | ORAL_TABLET | ORAL | Status: DC | PRN

## 2019-09-15 MED ORDER — ONDANSETRON HCL 4 MG/2ML IJ SOLN: 4.0000 mg | INTRAMUSCULAR | Status: DC | PRN

## 2019-09-15 MED ORDER — ONDANSETRON HCL 4 MG PO TABS: 4.0000 mg | ORAL_TABLET | ORAL | Status: DC | PRN

## 2019-09-15 NOTE — H&P (Signed)
OBSTETRIC ADMISSION HISTORY AND PHYSICAL  Jasmine Ritter is a 34 y.o. female [phone] with IUP at [redacted]w[redacted]d by 18 wk [place] presenting for elective IOL. She reports +FMs, No LOF, no VB, no blurry vision, headaches or peripheral edema, and RUQ pain.  She plans on bottle feeding. Her husband is planning a vasectomy for birth control. She received her prenatal [facility] at [facility]   Dating: By 18 wk [place] --->  Estimated Date of Delivery: 09/21/19  Sono:  @[redacted]w[redacted]d , normal anatomy, cephalic presentation, 3362g, EFW   Prenatal History/Complications: History of shoulder dystocia (4031 g baby) Hx of twin delivery with surrogate pregnancy GBS positive obesity  Past Medical History: Past Medical History:  Diagnosis Date  . ADHD   . Anemia     Past Surgical History: Past Surgical History:  Procedure Laterality Date  . NO PAST SURGERIES      Obstetrical History: OB History    Gravida  4   Para  3   Term  3   Preterm  0   AB  0   Living  4     SAB  0   TAB  0   Ectopic  0   Multiple  1   Live Births  4           Social History: Social History   Socioeconomic History  . Marital status: Married    Spouse name: Not on file  . Number of children: Not on file  . Years of education: Not on file  . Highest education level: Not on file  Occupational History  . Not on file  Tobacco Use  . Smoking status: Never Smoker  . Smokeless tobacco: Never Used  Vaping Use  . Vaping Use: Never used  Substance and Sexual Activity  . Alcohol use: Not Currently    Comment: socially  . Drug use: No  . Sexual activity: Yes    Birth control/protection: None  Other Topics Concern  . Not on file  Social History Narrative  . Not on file   Social Determinants of Health   Financial Resource Strain:   . Difficulty of Paying Living Expenses: Not on file  Food Insecurity: No Food Insecurity  . Worried About 14% in the Last Year: Never true  . Ran Out of Food in the Last  Year: Never true  Transportation Needs: No Transportation Needs  . Lack of Transportation (Medical): No  . Lack of Transportation (Non-Medical): No  Physical Activity:   . Days of Exercise per Week: Not on file  . Minutes of Exercise per Session: Not on file  Stress:   . Feeling of Stress : Not on file  Social Connections:   . Frequency of Communication with Friends and Family: Not on file  . Frequency of Social Gatherings with Friends and Family: Not on file  . Attends Religious Services: Not on file  . Active Member of Clubs or Organizations: Not on file  . Attends Programme researcher, broadcasting/film/video Meetings: Not on file  . Marital Status: Not on file    Family History: Family History  Problem Relation Age of Onset  . Cancer Mother   . Breast cancer Mother   . Diabetes Paternal Grandfather   . ADD / ADHD Father     Allergies: No Known Allergies  Medications Prior to Admission  Medication Sig Dispense Refill Last Dose  . Prenatal Vit-Fe Fumarate-FA (PRENATAL VITAMINS PO) Take 1 tablet by mouth daily.  Review of Systems   All systems reviewed and negative except as stated in HPI  Blood pressure 113/75, pulse 99, height 5\' 3"  (1.6 m), weight 90.3 kg, last menstrual period 12/07/2018, not currently breastfeeding. General appearance: alert, cooperative and no distress Lungs: clear to auscultation bilaterally Heart: regular rate and rhythm Abdomen: soft, non-tender; bowel sounds normal Pelvic: n/a Extremities: Homans sign is negative, no sign of DVT DTR's +2 Presentation: cephalic Fetal monitoringBaseline: 135 bpm, Variability: Good {> 6 bpm), Accelerations: Reactive and Decelerations: Absent Uterine activity: occasional uc's  Cervix: 3-4/80   Prenatal labs: ABO, Rh: A/Positive/-- (02/22 08-14-1982) Antibody: Negative (02/22 0921) Rubella: 1.86 (02/22 0921) RPR: Non Reactive (02/22 0921)  HBsAg: Negative (02/22 0921)  HIV: Non Reactive (02/22 0921)  GBS: Positive/-- (08/12  1457)  1 hr Glucola: normal 3rd trimester A1c Genetic screening  Low risk Anatomy 09[birth year]-1979 normal  Prenatal Transfer Tool  Maternal Diabetes: No Genetic Screening: Normal Maternal Ultrasounds/Referrals: Normal Fetal Ultrasounds or other Referrals:  None Maternal Substance Abuse:  No Significant Maternal Medications:  None Significant Maternal Lab Results: Group B Strep positive  Results for orders placed or performed during the hospital encounter of 09/15/19 (from the past 24 hour(s))  CBC   Collection Time: 09/15/19  7:48 AM  Result Value Ref Range   WBC 6.2 4.0 - 10.5 K/uL   RBC 3.44 (L) 3.87 - 5.11 MIL/uL   Hemoglobin 9.8 (L) 12.0 - 15.0 g/dL   HCT 09/17/19 (L) 36 - 46 %   MCV 91.6 80.0 - 100.0 fL   MCH 28.5 26.0 - 34.0 pg   MCHC 31.1 30.0 - 36.0 g/dL   RDW 27.0 62.3 - 76.2 %   Platelets 164 150 - 400 K/uL   nRBC 0.0 0.0 - 0.2 %    Patient Active Problem List   Diagnosis Date Noted  . Indication for care in labor and delivery, antepartum 09/15/2019  . Group B Streptococcus carrier, +RV culture, currently pregnant 09/02/2019  . Supervision of low-risk pregnancy 02/21/2019  . Obesity in pregnancy, antepartum 02/21/2019  . Shoulder dystocia, delivered 11/04/2017    Assessment/Plan:  Jasmine Ritter is a 34 y.o. 38 at [redacted]w[redacted]d here for elective induction.  #Labor: Will cover GBS prophylaxis and start pitocin after adequate coverage #Pain: Planning epidural #FWB: Cat 1 #ID:  GBS pos, PCN #MOF: Bottle #MOC: SO to get vasectomy #Circ:  no  [redacted]w[redacted]d, CNM  09/15/2019, 9:31 AM

## 2019-09-15 NOTE — Progress Notes (Signed)
Labor Progress Note Jasmine Ritter is a 34 y.o. [phone] at [redacted]w[redacted]d presented for elective IOL for hx shoulder dystocia  S:  Patient starting to feel cramping  O:  BP 113/75   Pulse 99   Ht 5\' 3"  (1.6 m)   Wt 90.3 kg   LMP 12/07/2018   BMI 35.25 kg/m   Fetal Tracing:  Baseline: 135 Variability: moderate Accels: 15x15 Decels: none  Toco: 3-4   CVE: Dilation: 3.5 Effacement (%): 70 Station: -2 Presentation: Vertex Exam by:: [email or web address] CNM   A&P: 34 y.o. 38 [redacted]w[redacted]d IOL elective for hx shoulder dystocia #Labor: Progressing well. Discussed with patient risks and benefits of AROM for augmentation of labor. Patient agreeable to plan of care. AROM with small amount of clear fluid. Patient and FHR tolerated procedure well. Will continue to titrate pitocin #Pain: planning epidural #FWB: Cat 1 #GBS positive  [redacted]w[redacted]d, CNM 4:09 PM

## 2019-09-15 NOTE — Anesthesia Preprocedure Evaluation (Addendum)
Anesthesia Evaluation  Patient identified by MRN, date of birth, ID band Patient awake    Reviewed: Allergy & Precautions, H&P , NPO status , Patient's Chart, lab work & pertinent test results, reviewed documented beta blocker date and time   Airway Mallampati: I  TM Distance: >3 FB Neck ROM: full    Dental no notable dental hx. (+) Teeth Intact, Dental Advisory Given   Pulmonary neg pulmonary ROS,    Pulmonary exam normal breath sounds clear to auscultation       Cardiovascular negative cardio ROS Normal cardiovascular exam Rhythm:regular Rate:Normal     Neuro/Psych negative neurological ROS  negative psych ROS   GI/Hepatic negative GI ROS, Neg liver ROS,   Endo/Other  Morbid obesity  Renal/GU negative Renal ROS  negative genitourinary   Musculoskeletal   Abdominal   Peds  Hematology  (+) Blood dyscrasia, anemia ,   Anesthesia Other Findings   Reproductive/Obstetrics (+) Pregnancy                           Anesthesia Physical Anesthesia Plan  ASA: II  Anesthesia Plan: Epidural   Post-op Pain Management:    Induction:   PONV Risk Score and Plan:   Airway Management Planned:   Additional Equipment:   Intra-op Plan:   Post-operative Plan:   Informed Consent: I have reviewed the patients History and Physical, chart, labs and discussed the procedure including the risks, benefits and alternatives for the proposed anesthesia with the patient or authorized representative who has indicated his/her understanding and acceptance.     Dental Advisory Given  Plan Discussed with: Anesthesiologist  Anesthesia Plan Comments: (Labs checked- platelets confirmed with RN in room. Fetal heart tracing, per RN, reported to be stable enough for sitting procedure. Discussed epidural, and patient consents to the procedure:  included risk of possible headache,backache, failed block, allergic  reaction, and nerve injury. This patient was asked if she had any questions or concerns before the procedure started.)       Anesthesia Quick Evaluation

## 2019-09-15 NOTE — Anesthesia Procedure Notes (Addendum)
Epidural Patient location during procedure: OB Start time: 09/15/2019 4:30 PM End time: 09/15/2019 4:42 PM  Staffing Anesthesiologist: Bethena Midget, MD Performed: anesthesiologist   Preanesthetic Checklist Completed: patient identified, IV checked, site marked, risks and benefits discussed, surgical consent, monitors and equipment checked, pre-op evaluation and timeout performed  Epidural Patient position: sitting Prep: DuraPrep and site prepped and draped Patient monitoring: continuous pulse ox and blood pressure Approach: midline Location: L3-L4 Injection technique: LOR air  Needle:  Needle type: Tuohy  Needle gauge: 17 G Needle length: 9 cm and 9 Needle insertion depth: 9 cm Catheter type: closed end flexible Catheter size: 19 Gauge Catheter at skin depth: 14 cm Test dose: negative  Assessment Events: blood not aspirated, injection not painful, no injection resistance, no paresthesia and negative IV test  Additional Notes Patient identified. Risks and benefits discussed including failed block, incomplete  Pain control, post dural puncture headache, nerve damage, paralysis, blood pressure Changes, nausea, vomiting, reactions to medications-both toxic and allergic and post Partum back pain. All questions were answered. Patient expressed understanding and wished to proceed. Sterile technique was used throughout procedure. Epidural site was Dressed with sterile barrier dressing. No paresthesias, signs of intravascular injection Or signs of intrathecal spread were encountered.  Patient was more comfortable after the epidural was dosed. Please see RN's note for documentation of vital signs and FHR which are stable. Reason for block:procedure for pain

## 2019-09-15 NOTE — Discharge Summary (Signed)
Postpartum Discharge Summary  Date of Service updated 09/16/19     Patient Name: Jasmine Ritter DOB: 1985/08/07 MRN: [id number]  Date of admission: 09/15/2019 Delivery date:09/15/2019  Delivering provider: Manya Silvas  Date of discharge: 09/16/2019  Admitting diagnosis: Indication for care in labor and delivery, antepartum [O75.9] Intrauterine pregnancy: [redacted]w[redacted]d    Secondary diagnosis:  Active Problems:   Supervision of low-risk pregnancy   Group B Streptococcus carrier, +RV culture, currently pregnant   Indication for care in labor and delivery, antepartum   Vaginal delivery  Additional problems: none    Discharge diagnosis: Term Pregnancy Delivered                                              Post partum procedures:none Augmentation: AROM and Pitocin Complications: None  Hospital course: Induction of Labor With Vaginal Delivery   34y.o. yo [id number] admitted to the hospital 09/15/2019 for induction of labor.  Indication for induction: Favorable cervix at term.  Patient had an uncomplicated labor course as follows: Membrane Rupture Time/Date: 4:14 PM ,09/15/2019   Delivery Method:Vaginal, Spontaneous  Episiotomy:   Lacerations:  2nd degree  Details of delivery can be found in separate delivery note.  Patient had a routine postpartum course. Patient is discharged home 09/16/19.  Newborn Data: Birth date:09/15/2019  Birth time:7:03 PM  Gender:Female  Living status:Living  Apgars:9 ,9  Weight:3135 g   Magnesium Sulfate received: No BMZ received: No Rhophylac:N/A MMR:N/A T-DaP:unknown Flu: N/A Transfusion:No  Physical exam  Vitals:   09/16/19 1115 09/16/19 1209 09/16/19 2030 09/16/19 2120  BP: (!) 134/93 118/82 121/81 118/77  Pulse: 82 72 72 85  Resp: _0 Temp: (!) 97.4 F (36.3 C)  98.1 F (36.7 C) 97.8 F (36.6 C)  TempSrc: Oral  Oral Oral  SpO2: 99% 100% 100% 98%  Weight:      Height:       General: alert, cooperative and no  distress Lochia: appropriate Uterine Fundus: firm Incision: N/A DVT Evaluation: No evidence of DVT seen on physical exam. Labs: Lab Results  Component Value Date   WBC 7.6 09/16/2019   HGB 9.6 (L) 09/16/2019   HCT 29.7 (L) 09/16/2019   MCV 89.5 09/16/2019   PLT 151 09/16/2019   CMP Latest Ref Rng & Units 02/24/2015  Glucose 65 - 99 mg/dL 104(H)  BUN 6 - 20 mg/dL 11  Creatinine 0.44 - 1.00 mg/dL 0.66  Sodium 135 - 145 mmol/L 138  Potassium 3.5 - 5.1 mmol/L 3.8  Chloride 101 - 111 mmol/L 106  CO2 22 - 32 mmol/L 25  Calcium 8.9 - 10.3 mg/dL 9.1  Total Protein 6.5 - 8.1 g/dL 7.5  Total Bilirubin 0.3 - 1.2 mg/dL 0.9  Alkaline Phos 38 - 126 U/L 59  AST 15 - 41 U/L 13(L)  ALT 14 - 54 U/L 14   Edinburgh Score: Edinburgh Postnatal Depression Scale Screening Tool 09/16/2019  I have been able to laugh and see the funny side of things. 0  I have looked forward with enjoyment to things. 0  I have blamed myself unnecessarily when things went wrong. 1  I have been anxious or worried for no good reason. 0  I have felt scared or panicky for no good reason. 0  Things have been getting on top of me.  1  I have been so unhappy that I have had difficulty sleeping. 0  I have felt sad or miserable. 0  I have been so unhappy that I have been crying. 0  The thought of harming myself has occurred to me. 0  Edinburgh Postnatal Depression Scale Total 2     After visit meds:  Allergies as of 09/16/2019   No Known Allergies     Medication List    TAKE these medications   acetaminophen 325 MG tablet Commonly known as: Tylenol Take 2 tablets (650 mg total) by mouth every 4 (four) hours as needed (for pain scale < 4).   ibuprofen 600 MG tablet Commonly known as: ADVIL Take 1 tablet (600 mg total) by mouth every 6 (six) hours. Start taking on: September 17, 2019   PRENATAL VITAMINS PO Take 1 tablet by mouth daily.        Discharge home in stable condition Infant Feeding: Bottle Infant  Disposition:home with mother Discharge instruction: per After Visit Summary and Postpartum booklet. Activity: Advance as tolerated. Pelvic rest for 6 weeks.  Diet: routine diet Future Appointments:No future appointments. Follow up Visit:   Please schedule this patient for a Virtual postpartum visit in 4 weeks with the following provider: Any provider. Additional Postpartum F/U:None  Low risk pregnancy complicated by: Nothing Delivery mode:  Vaginal, Spontaneous  Anticipated Birth Control:  vasectomy   7/49/3552 Arrie Senate, MD

## 2019-09-15 NOTE — Progress Notes (Signed)
Labor Progress Note Jasmine Ritter is a 34 y.o. [phone] at [redacted]w[redacted]d presented for elective IOL for hx of shoulder dystocia in previous pregnancy  S:  Patient sleeping.  O:  BP 113/75   Pulse 99   Ht 5\' 3"  (1.6 m)   Wt 90.3 kg   LMP 12/07/2018   BMI 35.25 kg/m   Fetal Tracing:  Baseline: 140 Variability: moderate Accels: 15x15 Decels: none  Toco: occasional uc's  A&P: 34 y.o. 38 [redacted]w[redacted]d elective IOL #Labor: Will start pitocin now s/p antibiotics for GBS  #Pain: planning epidural #FWB: Cat 1 #GBS positive  [redacted]w[redacted]d, CNM 1:06 PM

## 2019-09-16 LAB — CBC
HCT: 29.7 % — ABNORMAL LOW (ref 36.0–46.0)
Hemoglobin: 9.6 g/dL — ABNORMAL LOW (ref 12.0–15.0)
MCH: 28.9 pg (ref 26.0–34.0)
MCHC: 32.3 g/dL (ref 30.0–36.0)
MCV: 89.5 fL (ref 80.0–100.0)
Platelets: 151 10*3/uL (ref 150–400)
RBC: 3.32 MIL/uL — ABNORMAL LOW (ref 3.87–5.11)
RDW: 13.5 % (ref 11.5–15.5)
WBC: 7.6 10*3/uL (ref 4.0–10.5)
nRBC: 0 % (ref 0.0–0.2)

## 2019-09-16 MED ORDER — IBUPROFEN 600 MG PO TABS
600.0000 mg | ORAL_TABLET | Freq: Four times a day (QID) | ORAL | 0 refills | Status: DC
Start: 2019-09-17 — End: 2020-08-15

## 2019-09-16 MED ORDER — ACETAMINOPHEN 325 MG PO TABS
650.0000 mg | ORAL_TABLET | ORAL | 0 refills | Status: DC | PRN
Start: 2019-09-16 — End: 2020-08-15

## 2019-09-16 NOTE — Discharge Instructions (Signed)

## 2019-09-16 NOTE — Progress Notes (Signed)
MOB was referred for history of depression/anxiety. * Referral screened out by Clinical Social Worker because none of the following criteria appear to apply: ~ History of anxiety/depression during this pregnancy, or of post-partum depression following prior delivery. ~ Diagnosis of anxiety and/or depression within last 3 years. Per further chart review, it is noted that MOB was diagnosed with depression 02/2015.  OR * MOB's symptoms currently being treated with medication and/or therapy.    Please contact the Clinical Social Worker if needs arise, by MOB request, or if MOB scores greater than 9/yes to question 10 on Edinburgh Postpartum Depression Screen.   Milbert Bixler S. Gareld Obrecht, MSW, LCSW [facility] [phone]  

## 2019-09-16 NOTE — Anesthesia Postprocedure Evaluation (Signed)
Anesthesia Post Note  Patient: Jasmine Ritter  Procedure(s) Performed: AN AD HOC LABOR EPIDURAL     Patient location during evaluation: Mother Baby Anesthesia Type: Epidural Level of consciousness: awake and alert Pain management: pain level controlled Vital Signs Assessment: post-procedure vital signs reviewed and stable Respiratory status: spontaneous breathing, nonlabored ventilation and respiratory function stable Cardiovascular status: stable Postop Assessment: no headache, no backache and epidural receding Anesthetic complications: no   No complications documented.  Last Vitals:  Vitals:   09/16/19 1115 09/16/19 1209  BP: (!) 134/93 118/82  Pulse: 82 72  Resp: 14   Temp: (!) 36.3 C   SpO2: 99% 100%    Last Pain:  Vitals:   09/16/19 1211  TempSrc:   PainSc: 5    Pain Goal:                   [name]

## 2019-09-16 NOTE — Progress Notes (Signed)
Post Partum Day 1 Subjective: Patient is doing well without complaints. Ambulating without difficulty. Voiding. Has not passed flatus/had BM. Tolerating PO. Abdominal pain improved. Vaginal bleeding decreased.  Objective: Blood pressure 128/86, pulse 63, temperature 98.1 F (36.7 C), temperature source Oral, resp. rate 15, height 5\' 3"  (1.6 m), weight 90.3 kg, last menstrual period 12/07/2018, SpO2 98 %, unknown if currently breastfeeding.  Physical Exam:  General: alert, cooperative and no distress Lochia: appropriate Uterine Fundus: firm Incision: n/a DVT Evaluation: No evidence of DVT seen on physical exam.  Recent Labs    09/15/19 0748  HGB 9.8*  HCT 31.5*    Assessment/Plan: PPD#1 after uncomplicated SV  -Doing well without concerns  -Has not passed flatus/had BM yet  -plan for vasectomy, declines interval contraception  -bottle/breast  Plan for discharge tomorrow.   LOS: 1 day   09/17/19 09/16/2019, 5:24 AM

## 2019-09-16 NOTE — Addendum Note (Signed)
Addendum  created 09/16/19 1551 by Renford Dills, CRNA   Clinical Note Signed

## 2019-09-16 NOTE — Anesthesia Postprocedure Evaluation (Signed)
Anesthesia Post Note  Patient: Jasmine Ritter  Procedure(s) Performed: AN AD HOC LABOR EPIDURAL     Anesthesia Type: Epidural Anesthetic complications: no   No complications documented.  Last Vitals:  Vitals:   09/16/19 1115 09/16/19 1209  BP: (!) 134/93 118/82  Pulse: 82 72  Resp: 14   Temp: (!) 36.3 C   SpO2: 99% 100%    Last Pain:  Vitals:   09/16/19 1211  TempSrc:   PainSc: 5    Pain Goal:                   Jacole Capley A.

## 2019-10-02 IMAGING — US US MFM OB COMP +14 WKS
1 series · 14 of 28 positions shown · non-contrast
Comparison: none

[Series 1: us mfm ob comp +14 wks · 100 acquisitions, 14 frames shown]
[im 4/100]
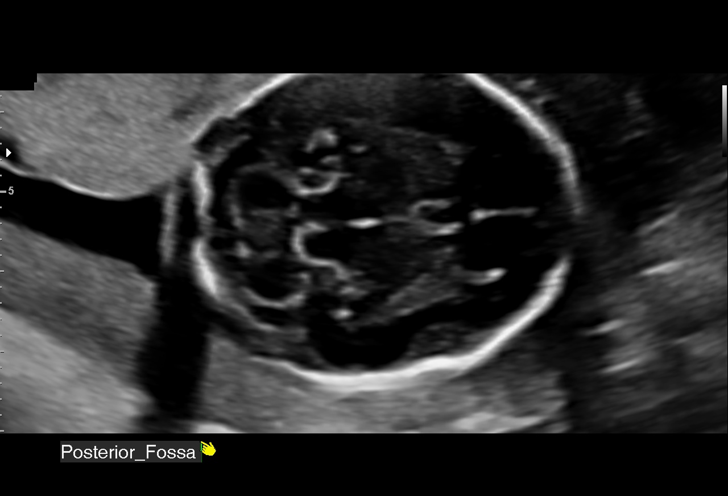
[im 12/100]
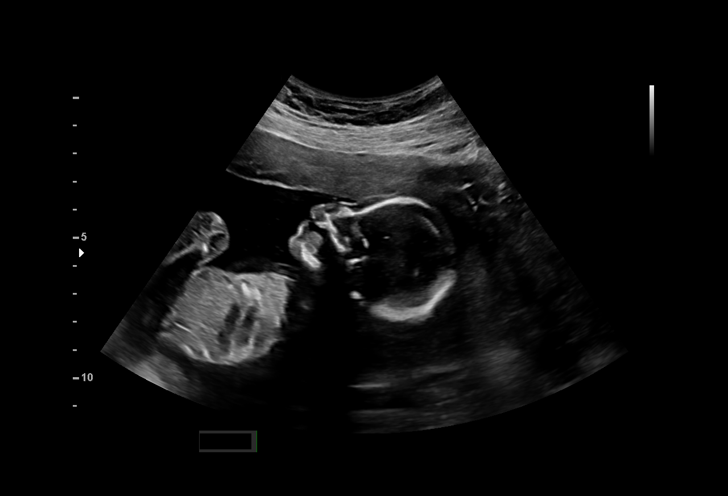
[im 19/100]
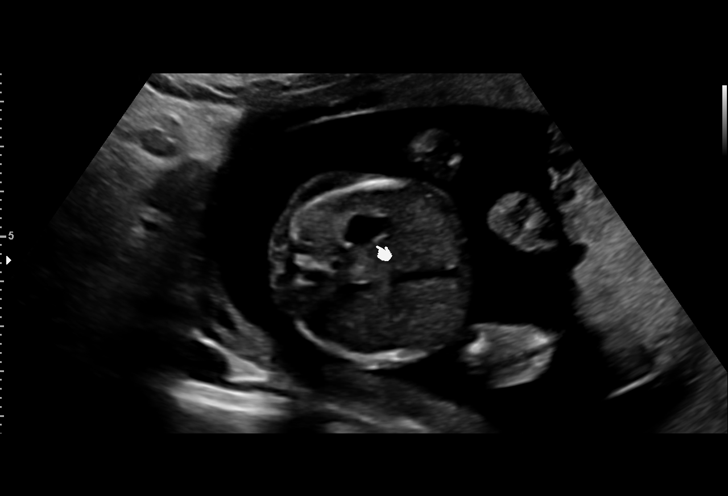
[im 26/100]
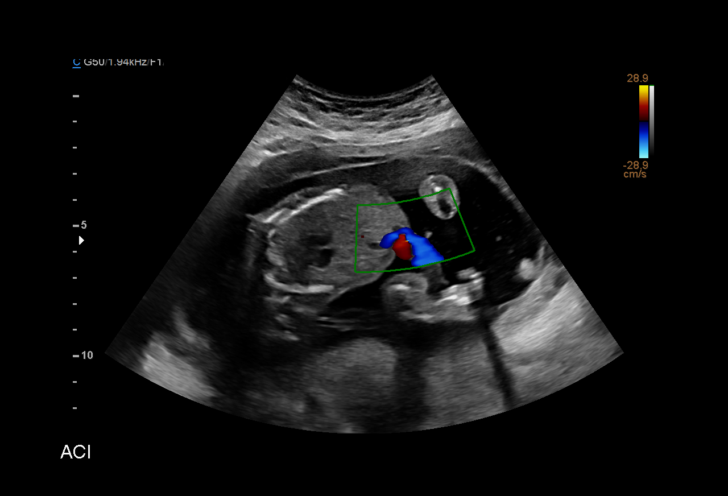
[im 34/100]
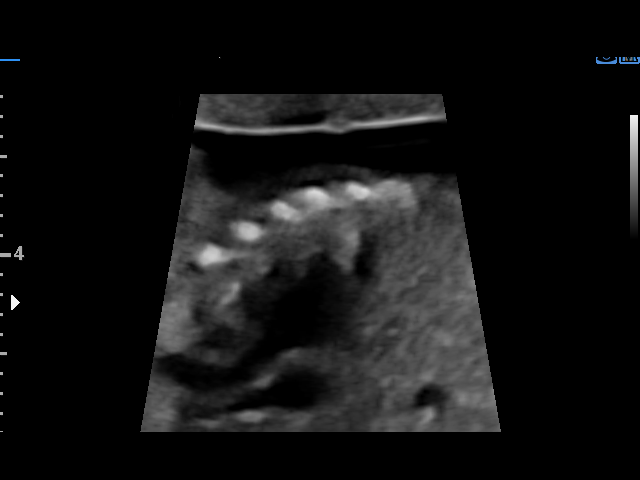
[im 41/100]
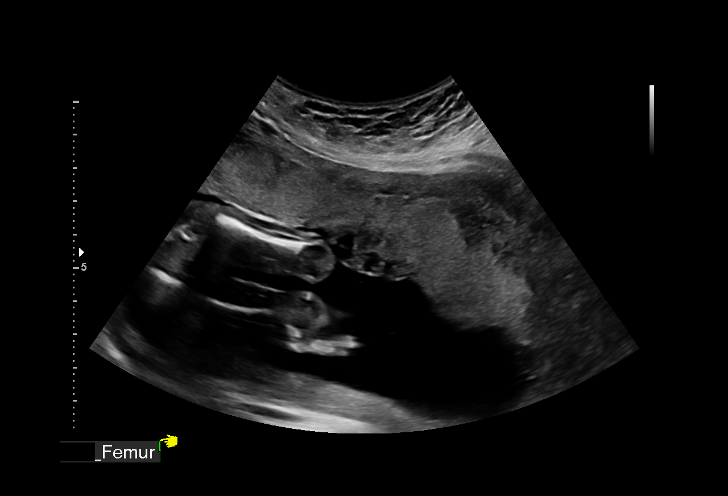
[im 48/100]
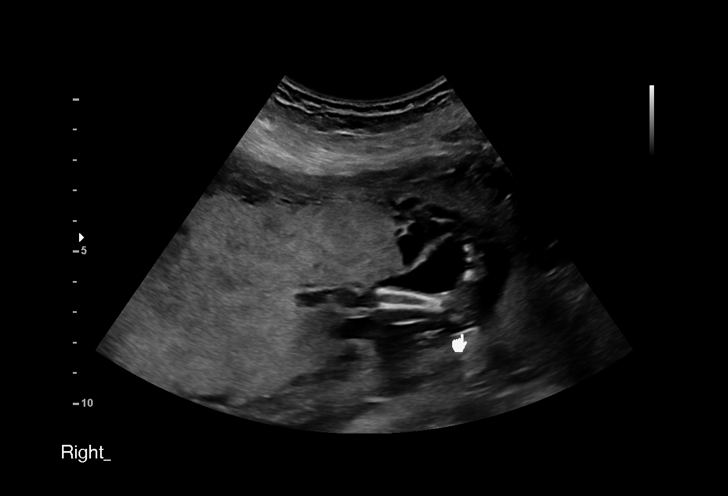
[im 56/100]
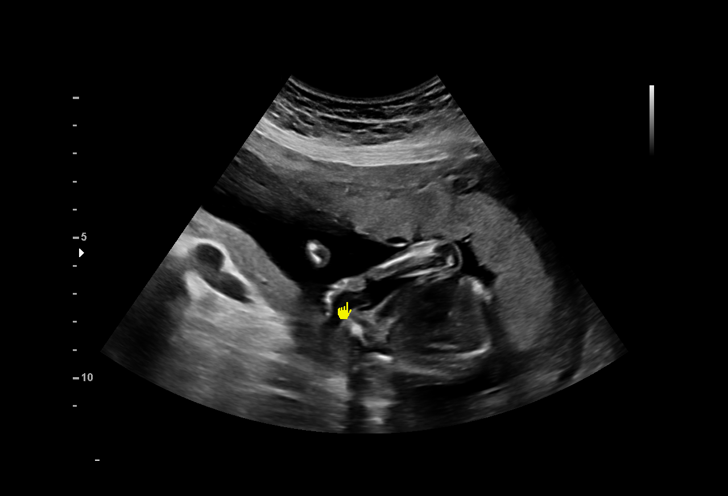
[im 63/100]
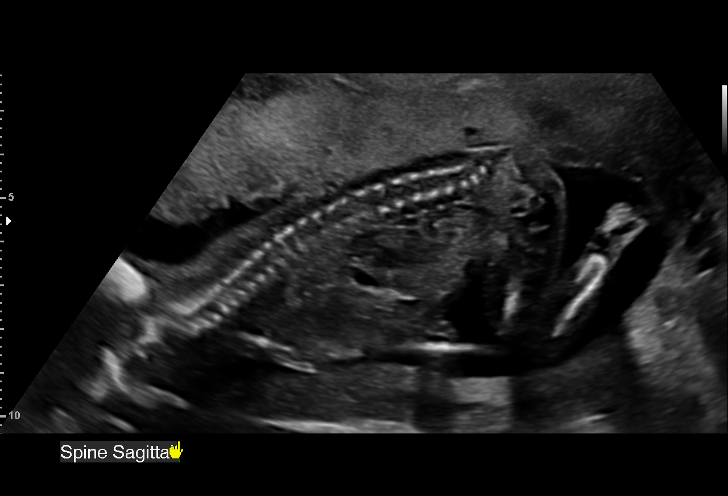
[im 70/100]
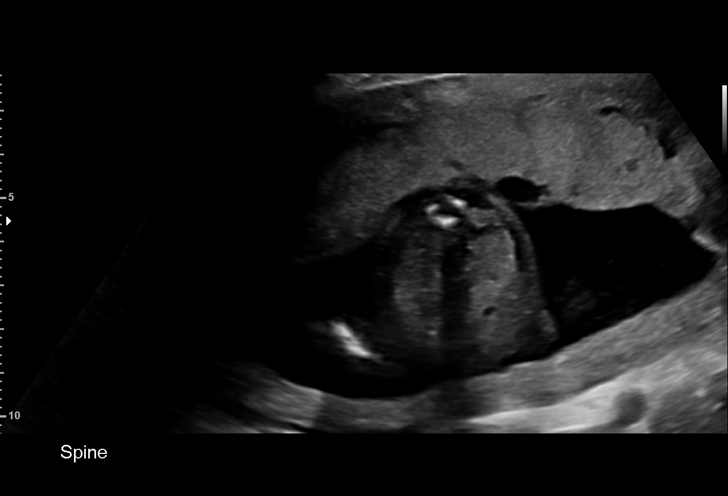
[im 78/100]
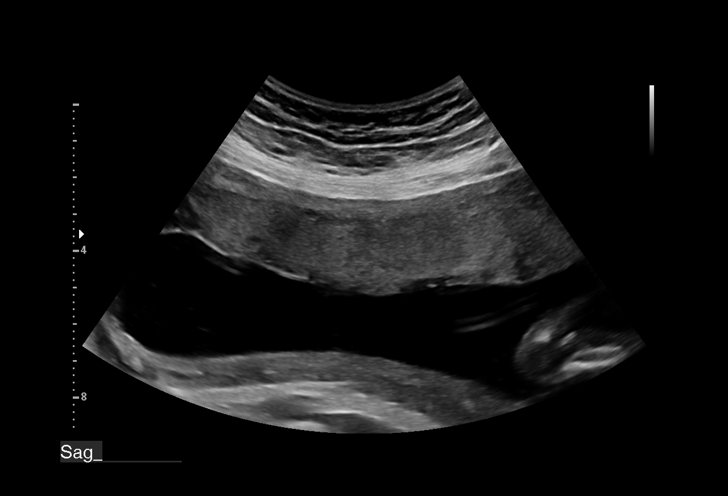
[im 85/100]
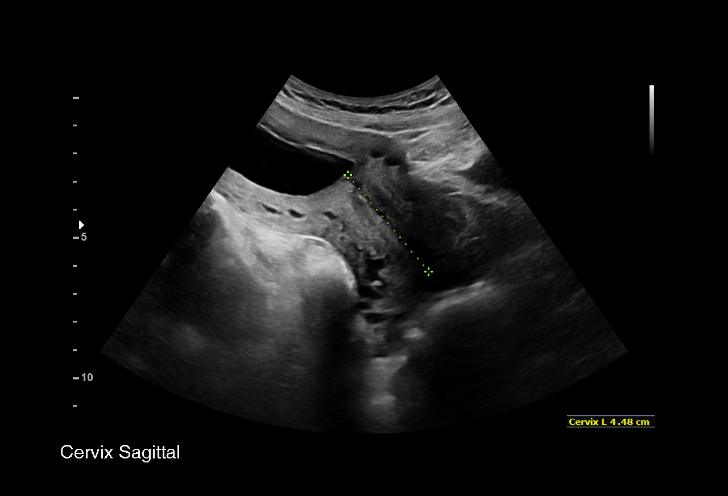
[im 92/100]
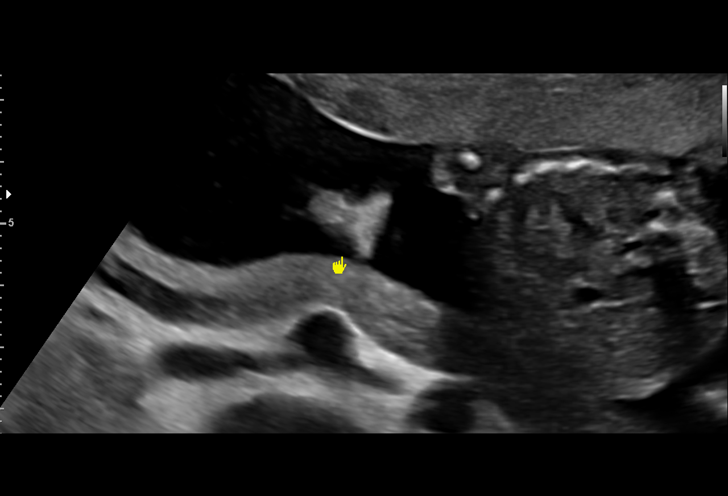
[im 100/100]
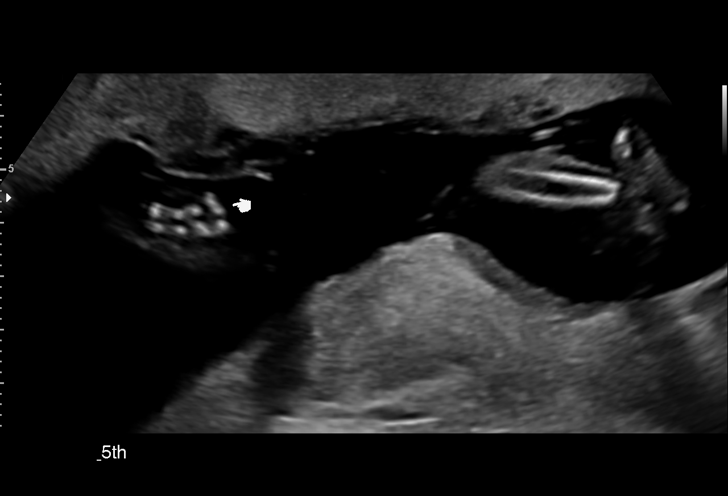

[14 of 28 positions shown; findings below may reference images not displayed]

1  KLPIGBB MOOLMAN             [id number]      [id number]     [id number]
Indications

19 weeks gestation of pregnancy
Encounter for antenatal screening for
malformations
OB History

Blood Type:            Height:  5'4"   Weight (lb):  163       BMI:
Gravidity:    3         Term:   2
Living:       3
Fetal Evaluation

Num Of Fetuses:     1
Fetal Heart         153
Rate(bpm):
Cardiac Activity:   Observed
Presentation:       Variable
Placenta:           Anterior, above cervical os
P. Cord Insertion:  Visualized

Amniotic Fluid
AFI FV:      Subjectively within normal limits

Largest Pocket(cm)
3.79
Biometry

BPD:        40  mm     G. Age:  18w 1d         17  %    CI:        75.48   %    70 - 86
FL/HC:      19.2   %    16.1 -
HC:       146   mm     G. Age:  17w 6d          4  %    HC/AC:      1.03        1.09 -
AC:       142   mm     G. Age:  19w 4d         64  %    FL/BPD:     70.0   %
FL:         28  mm     G. Age:  18w 4d         29  %    FL/AC:      19.7   %    20 - 24
HUM:      28.4  mm     G. Age:  19w 1d         55  %
CER:        18  mm     G. Age:  18w 0d         20  %
NFT:       4.3  mm
CM:        4.2  mm

Est. FW:     265  gm      0 lb 9 oz     43  %
Gestational Age

LMP:           19w 0d        Date:  01/24/17                 EDD:   10/31/17
U/S Today:     18w 4d                                        EDD:   11/03/17
Best:          19w 0d     Det. By:  LMP  (01/24/17)          EDD:   10/31/17
Anatomy

Cranium:               Appears normal         Aortic Arch:            Appears normal
Cavum:                 Appears normal         Ductal Arch:            Appears normal
Ventricles:            Appears normal         Diaphragm:              Appears normal
Choroid Plexus:        Appears normal         Stomach:                Appears normal, left
sided
Cerebellum:            Appears normal         Abdomen:                Appears normal
Posterior Fossa:       Appears normal         Abdominal Wall:         Appears nml (cord
insert, abd wall)
Nuchal Fold:           Appears normal         Cord Vessels:           Appears normal (3
vessel cord)
Face:                  Appears normal         Kidneys:                Appear normal
(orbits and profile)
Lips:                  Appears normal         Bladder:                Appears normal
Thoracic:              Appears normal         Spine:                  Not well visualized
Heart:                 Appears normal         Upper Extremities:      Appears normal
(4CH, axis, and situs
RVOT:                  Appears normal         Lower Extremities:      Appears normal
LVOT:                  Appears normal
Cervix Uterus Adnexa

Cervix
Length:           4.48  cm.
Normal appearance by transabdominal scan.

Uterus
No abnormality visualized.

Left Ovary
Size(cm)       4.5  x   1.9    x  2.5       Vol(ml):
Within normal limits.

Right Ovary
Size(cm)       3.8  x   2.2    x  3.4       Vol(ml):
Within normal limits.
Impression

Single living intrauterine pregnancy at 19w 0d.
Placenta Anterior, above cervical os.
Appropriate fetal growth.
Normal amniotic fluid volume.
The fetal spine is not well visualized.
The fetal anatomic survey is otherwise complete.
Normal fetal anatomy.
No fetal anomalies or soft markers of aneuploidy seen.
The adnexa appear normal bilaterally without masses.
The cervix measures 4.48cm on transabdominal imaging
without funneling.
Recommendations

Recommend follow-up ultrasound examination in 6 weeks for
reassessment of fetal spine.

## 2019-10-15 ENCOUNTER — Telehealth (INDEPENDENT_AMBULATORY_CARE_PROVIDER_SITE_OTHER): Payer: Medicaid Other | Admitting: Nurse Practitioner

## 2019-10-15 DIAGNOSIS — Z91199 Patient's noncompliance with other medical treatment and regimen due to unspecified reason: Secondary | ICD-10-CM

## 2019-10-15 DIAGNOSIS — Z5329 Procedure and treatment not carried out because of patient's decision for other reasons: Secondary | ICD-10-CM

## 2019-10-15 NOTE — Progress Notes (Signed)
3:18pm-- called patient and she states she tried to call us on Friday to reschedule the appt but we were already closed. Patient states she was going to call back yesterday but forgot. Told patient I would reach out to our front office staff and have her appt rescheduled. Patient verbalized understanding.

## 2019-10-15 NOTE — Progress Notes (Signed)
2:52P-Called for My Chart visit, no answer, will call back in 10 to 15 minutes.  3:32p- Called pt back to do My Chart visit, Pt states does not do virtual visits , wants face to face visit only. Advised front desk will call her back to schedule a in office visit. Pt really wouldn't respond.   Chart reviewed for nurse visit. Agree with plan of care.   Currie Paris, NP 10/15/2019 7:40 PM

## 2019-10-23 ENCOUNTER — Ambulatory Visit: Payer: Medicaid Other | Admitting: Obstetrics and Gynecology

## 2020-08-09 ENCOUNTER — Other Ambulatory Visit: Payer: Self-pay

## 2020-08-09 ENCOUNTER — Ambulatory Visit (HOSPITAL_COMMUNITY)
Admission: EM | Admit: 2020-08-09 | Discharge: 2020-08-09 | Disposition: A | Discharge status: Home / Self Care | Payer: Medicaid Other | Attending: Psychiatry | Admitting: Psychiatry

## 2020-08-09 DIAGNOSIS — R45851 Suicidal ideations: Secondary | ICD-10-CM | POA: Insufficient documentation

## 2020-08-09 DIAGNOSIS — F4323 Adjustment disorder with mixed anxiety and depressed mood: Secondary | ICD-10-CM

## 2020-08-09 NOTE — ED Provider Notes (Signed)
Behavioral Health Urgent Care Medical Screening Exam  Patient Name: Jasmine Ritter MRN: [id number] Date of Evaluation: 08/09/20 Chief Complaint:   Diagnosis:  Final diagnoses:  None    History of Present illness: Jasmine Ritter is a 35 y.o. female.  Presents to [facility] urgent care accompanied by her husband.  He reports concerns that patient has suicidal statement and has been depressed lately.  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 During this assessment and plan to meet minimal eye contact.  Stating " I am not depressed" she deferred most of the response to her husband.  She denied suicidal or homicidal ideations.  Denies auditory or visual hallucinations.  Denied previous inpatient admissions.  Denied that she is followed by therapy and/or psychiatry currently.  Patient responses are short, sharp and abrupt.  She presents with mood irritability.   NP spoke to patient's husband regarding any additional safety concerns.   Rise stated " I would never kill myself with a gun."  He reported patient stated that she was going to run away from tonight.   Ellenor's husband has requested patient to get additional help for therapy rescourse.  Patient was offered overnight observation however family declined.  We will make additional outpatient resources available.   Patient to consider intensive outpatient programming and/or DBT therapy.  Discussed the need for involuntary commitment if he feels she is going to be a danger to herself or others.  He was receptive to plan.  Support, encouragement and reassurance was provided.  Psychiatric Specialty Exam  Presentation  General Appearance:Appropriate for Environment  Eye Contact:Good  Speech:Clear and Coherent  Speech Volume:Normal  Handedness:Right   Mood and Affect  Mood:Anxious; Irritable; Labile  Affect:Congruent   Thought Process  Thought Processes:Coherent  Descriptions of  Associations:Intact  Orientation:Full (Time, Place and Person)  Thought Content:Logical    Hallucinations:None  Ideas of Reference:None  Suicidal Thoughts:No  Homicidal Thoughts:No   Sensorium  Memory:Immediate Good; Recent Good; Remote Good  Judgment:Fair  Insight:Fair   Executive Functions  Concentration:Fair  Attention Span:Good  Recall:Good  Fund of Knowledge:Good  Language:Good   Psychomotor Activity  Psychomotor Activity:Normal   Assets  Assets:Intimacy; Social Support   Sleep  Sleep:Fair  Number of hours:  No data recorded  Nutritional Assessment (For OBS and FBC admissions only) Has the patient had a weight loss or gain of 10 pounds or more in the last 3 months?: No Has the patient had a decrease in food intake/or appetite?: No Does the patient have dental problems?: No Does the patient have eating habits or behaviors that may be indicators of an eating disorder including binging or inducing vomiting?: No Has the patient recently lost weight without trying?: No Has the patient been eating poorly because of a decreased appetite?: No Malnutrition Screening Tool Score: 0    Physical Exam: Physical Exam Vitals and nursing note reviewed.  Constitutional:      Appearance: Normal appearance.  HENT:     Head: Normocephalic.  Eyes:     Pupils: Pupils are equal, round, and reactive to light.  Cardiovascular:     Rate and Rhythm: Normal rate and regular rhythm.  Pulmonary:     Effort: Pulmonary effort is normal.  Skin:    General: Skin is warm and dry.  Neurological:     Mental Status: She is alert.  Psychiatric:        Attention and Perception: Attention normal.  Mood and Affect: Mood normal. Affect is labile.        Speech: Speech normal.        Behavior: Behavior is agitated.        Thought Content: Thought content normal.        Cognition and Memory: Cognition normal.        Judgment: Judgment normal.   Review of Systems   Psychiatric/Behavioral:  Negative for depression and suicidal ideas. The patient is not nervous/anxious.   All other systems reviewed and are negative. Blood pressure (!) 133/92, pulse 64, temperature 98.5 F (36.9 C), temperature source Oral, resp. rate 16, SpO2 100 %, unknown if currently breastfeeding. There is no height or weight on file to calculate BMI.  Musculoskeletal: Strength & Muscle Tone: within normal limits Gait & Station: normal Patient leans: N/A   [facility] MSE Discharge Disposition for Follow up and Recommendations: Based on my evaluation the patient does not appear to have an emergency medical condition and can be discharged with resources and follow up care in outpatient services for Medication Management, Partial Hospitalization Program, Individual Therapy, and Group Therapy   Oneta Rack, NP 08/09/2020, 12:36 PM

## 2020-08-09 NOTE — Progress Notes (Signed)
Patient presents to the [facility] with her husband who states that he brought her in because she is suicidal.  Patient states that she and her husban had an argument today and she told him that she was going to the beach.  She denies that she ever said anything about being suicidal.  Patient states that she is angry with her husband because he has two degrees and is working as a Scientist, physiological at [employer].  Patient states that they have three childrn and that their finances are tight.  She states that she stays at home all the time with the kids because they would not be able to afford childcare for her to go to work.  She states that she has little contact with her family that she describes as "narcissistic assholes."  Patient states that she has no time for friends.  Patient states that she has a history of one suicide attempt in the past, but denies ever having been hospitalized and states that she is not currently receiving any outpatient services.  She denies HI/Psychosis and denies SA use. Patient did finally admit that she made statements about starving herself to death to her husband.  Patient again, is very gamey and attention seeking.

## 2020-08-09 NOTE — Discharge Instructions (Addendum)
Take all medications as prescribed. Keep all follow-up appointments as scheduled.  Do not consume alcohol or use illegal drugs while on prescription medications. Report any adverse effects from your medications to your primary care provider promptly.  In the event of recurrent symptoms or worsening symptoms, call 911, a crisis hotline, or go to the nearest emergency department for evaluation.   

## 2020-08-14 ENCOUNTER — Emergency Department (HOSPITAL_COMMUNITY)
Admission: EM | Admit: 2020-08-14 | Discharge: 2020-08-15 | Disposition: A | Discharge status: Home / Self Care | Payer: Medicaid Other | Source: Home / Self Care | Attending: Emergency Medicine | Admitting: Emergency Medicine

## 2020-08-14 ENCOUNTER — Encounter (HOSPITAL_COMMUNITY): Payer: Self-pay

## 2020-08-14 ENCOUNTER — Other Ambulatory Visit: Payer: Self-pay

## 2020-08-14 DIAGNOSIS — Y900 Blood alcohol level of less than 20 mg/100 ml: Secondary | ICD-10-CM | POA: Insufficient documentation

## 2020-08-14 DIAGNOSIS — Z79899 Other long term (current) drug therapy: Secondary | ICD-10-CM | POA: Insufficient documentation

## 2020-08-14 DIAGNOSIS — N9489 Other specified conditions associated with female genital organs and menstrual cycle: Secondary | ICD-10-CM | POA: Insufficient documentation

## 2020-08-14 DIAGNOSIS — F332 Major depressive disorder, recurrent severe without psychotic features: Secondary | ICD-10-CM | POA: Insufficient documentation

## 2020-08-14 DIAGNOSIS — R45851 Suicidal ideations: Secondary | ICD-10-CM

## 2020-08-14 DIAGNOSIS — Z20822 Contact with and (suspected) exposure to covid-19: Secondary | ICD-10-CM | POA: Insufficient documentation

## 2020-08-14 LAB — RESP PANEL BY RT-PCR (FLU A&B, COVID) ARPGX2
Influenza A by PCR: NEGATIVE
Influenza B by PCR: NEGATIVE
SARS Coronavirus 2 by RT PCR: NEGATIVE

## 2020-08-14 LAB — COMPREHENSIVE METABOLIC PANEL
ALT: 22 U/L (ref 0–44)
AST: 15 U/L (ref 15–41)
Albumin: 4.6 g/dL (ref 3.5–5.0)
Alkaline Phosphatase: 70 U/L (ref 38–126)
Anion gap: 10 (ref 5–15)
BUN: 13 mg/dL (ref 6–20)
CO2: 25 mmol/L (ref 22–32)
Calcium: 9.9 mg/dL (ref 8.9–10.3)
Chloride: 104 mmol/L (ref 98–111)
Creatinine, Ser: 0.62 mg/dL (ref 0.44–1.00)
GFR, Estimated: >60 mL/min (ref >60)
Glucose, Bld: 114 mg/dL — ABNORMAL HIGH (ref 70–99)
Potassium: 3.8 mmol/L (ref 3.5–5.1)
Sodium: 139 mmol/L (ref 135–145)
Total Bilirubin: 0.6 mg/dL (ref 0.3–1.2)
Total Protein: 8.3 g/dL — ABNORMAL HIGH (ref 6.5–8.1)

## 2020-08-14 LAB — CBC WITH DIFFERENTIAL/PLATELET
Abs Immature Granulocytes: 0.03 10*3/uL (ref 0.00–0.07)
Basophils Absolute: 0.1 10*3/uL (ref 0.0–0.1)
Basophils Relative: 1 %
Eosinophils Absolute: 0.1 10*3/uL (ref 0.0–0.5)
Eosinophils Relative: 1 %
HCT: 37.3 % (ref 36.0–46.0)
Hemoglobin: 12.4 g/dL (ref 12.0–15.0)
Immature Granulocytes: 0 %
Lymphocytes Relative: 20 %
Lymphs Abs: 1.5 10*3/uL (ref 0.7–4.0)
MCH: 29 pg (ref 26.0–34.0)
MCHC: 33.2 g/dL (ref 30.0–36.0)
MCV: 87.1 fL (ref 80.0–100.0)
Monocytes Absolute: 0.6 10*3/uL (ref 0.1–1.0)
Monocytes Relative: 8 %
Neutro Abs: 5.2 10*3/uL (ref 1.7–7.7)
Neutrophils Relative %: 70 %
Platelets: 238 10*3/uL (ref 150–400)
RBC: 4.28 MIL/uL (ref 3.87–5.11)
RDW: 12.6 % (ref 11.5–15.5)
WBC: 7.5 10*3/uL (ref 4.0–10.5)
nRBC: 0 % (ref 0.0–0.2)

## 2020-08-14 LAB — RAPID URINE DRUG SCREEN, HOSP PERFORMED
Amphetamines: NOT DETECTED
Barbiturates: NOT DETECTED
Benzodiazepines: NOT DETECTED
Cocaine: NOT DETECTED
Opiates: NOT DETECTED
Tetrahydrocannabinol: NOT DETECTED

## 2020-08-14 LAB — I-STAT BETA HCG BLOOD, ED (MC, WL, AP ONLY): I-stat hCG, quantitative: <5 m[IU]/mL (ref <5)

## 2020-08-14 LAB — ETHANOL: Alcohol, Ethyl (B): <10 mg/dL (ref <10)
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
## 2020-08-14 NOTE — ED Provider Notes (Signed)
[facility]EM[facility]ENC[facility]PT Provider Note   CSN: [id number] Arrival date & time: 08/14/20  1814     History Chief Complaint  Patient presents with   Suicidal    Jasmine Ritter is a 35 y.o. female.  HPI  35 year old female presenting with suicidal ideation with a plan, under IVC.  Per IVC paperwork, "the respondent has threatened to kill herself and began giving her personal items away.  She has been in a depressed mood for several weeks and expressed negative thoughts about herself.  The respondent has tried to sneak away with knives and threatened to cut her wrist.  The petitioner had to wrestle the knives away from the respondent.  Yesterday the respondent told the petitioner she is going to kill herself."  The patient states that she has had thoughts of suicide for the past several months.  Her plan would be to slit her wrists.  She denies any HI or AVH.  Past Medical History:  Diagnosis Date   ADHD    Anemia     Patient Active Problem List   Diagnosis Date Noted   Indication for care in labor and delivery, antepartum 09/15/2019   Vaginal delivery 09/15/2019   Group B Streptococcus carrier, +RV culture, currently pregnant 09/02/2019   Supervision of low-risk pregnancy 02/21/2019   Obesity in pregnancy, antepartum 02/21/2019   Shoulder dystocia, delivered 11/04/2017    Past Surgical History:  Procedure Laterality Date   NO PAST SURGERIES       OB History     Gravida  4   Para  4   Term  4   Preterm  0   AB  0   Living  5      SAB  0   IAB  0   Ectopic  0   Multiple  1   Live Births  5           Family History  Problem Relation Age of Onset   Cancer Mother    Breast cancer Mother    Diabetes Paternal Grandfather    ADD / ADHD Father     Social History   Tobacco Use   Smoking status: Never   Smokeless tobacco: Never  Vaping Use   Vaping Use: Never used  Substance Use Topics   Alcohol use: Not Currently     Comment: socially   Drug use: No    Home Medications Prior to Admission medications   Medication Sig Start Date End Date Taking? Authorizing Provider  acetaminophen (TYLENOL) 325 MG tablet Take 2 tablets (650 mg total) by mouth every 4 (four) hours as needed (for pain scale < 4). 09/16/19   Alric Seton, MD  ibuprofen (ADVIL) 600 MG tablet Take 1 tablet (600 mg total) by mouth every 6 (six) hours. 09/17/19   Alric Seton, MD  Prenatal Vit-Fe Fumarate-FA (PRENATAL VITAMINS PO) Take 1 tablet by mouth daily.    [provider]    Allergies    Patient has no known allergies.  Review of Systems   Review of Systems  Constitutional:  Negative for chills and fever.  HENT:  Negative for ear pain and sore throat.   Eyes:  Negative for pain and visual disturbance.  Respiratory:  Negative for cough and shortness of breath.   Cardiovascular:  Negative for chest pain and palpitations.  Gastrointestinal:  Negative for abdominal pain and vomiting.  Genitourinary:  Negative for dysuria and hematuria.  Musculoskeletal:  Negative  for arthralgias and back pain.  Skin:  Negative for color change and rash.  Neurological:  Negative for seizures and syncope.  Psychiatric/Behavioral:  Positive for suicidal ideas.   All other systems reviewed and are negative.  Physical Exam Updated Vital Signs BP (!) 150/95 (BP Location: Left Arm)   Pulse 95   Temp 98.6 F (37 C) (Oral)   Resp 18   SpO2 100%   Physical Exam Vitals and nursing note reviewed.  Constitutional:      General: She is not in acute distress.    Appearance: She is well-developed.  HENT:     Head: Normocephalic and atraumatic.  Eyes:     Conjunctiva/sclera: Conjunctivae normal.  Cardiovascular:     Rate and Rhythm: Normal rate and regular rhythm.     Heart sounds: No murmur heard. Pulmonary:     Effort: Pulmonary effort is normal. No respiratory distress.     Breath sounds: Normal breath sounds.  Abdominal:      Palpations: Abdomen is soft.     Tenderness: There is no abdominal tenderness.  Musculoskeletal:     Cervical back: Neck supple.  Skin:    General: Skin is warm and dry.  Neurological:     Mental Status: She is alert.  Psychiatric:        Mood and Affect: Mood normal. Affect is tearful.        Speech: Speech normal.        Behavior: Behavior normal. Behavior is cooperative.        Thought Content: Thought content is not paranoid or delusional. Thought content includes suicidal ideation. Thought content does not include homicidal ideation. Thought content includes suicidal plan.    ED Results / Procedures / Treatments   Labs (all labs ordered are listed, but only abnormal results are displayed) Labs Reviewed  COMPREHENSIVE METABOLIC PANEL - Abnormal; Notable for the following components:      Result Value   Glucose, Bld 114 (*)    Total Protein 8.3 (*)    All other components within normal limits  RESP PANEL BY RT-PCR (FLU A&B, COVID) ARPGX2  ETHANOL  RAPID URINE DRUG SCREEN, HOSP PERFORMED  CBC WITH DIFFERENTIAL/PLATELET  I-STAT BETA HCG BLOOD, ED ([facility], [facility], [facility] ONLY)    EKG None  Radiology No results found.  Procedures Procedures   Medications Ordered in ED Medications - No data to display  ED Course  I have reviewed the triage vital signs and the nursing notes.  Pertinent labs & imaging results that were available during my care of the patient were reviewed by me and considered in my medical decision making (see chart for details).    MDM Rules/Calculators/A&P                           36 year old female presenting with suicidal ideation with a plan, under IVC.  Per IVC paperwork, "the respondent has threatened to kill herself and began giving her personal items away.  She has been in a depressed mood for several weeks and expressed negative thoughts about herself.  The respondent has tried to sneak away with knives and threatened to cut her wrist.  The  petitioner had to wrestle the knives away from the respondent.  Yesterday the respondent told the petitioner she is going to kill herself."  The patient states that she has had thoughts of suicide for the past several months.  Her plan would be to slit  her wrists.  She denies any HI or AVH.  Screening undertaken in the emergency department for medical clearance.  The patient currently denies any complaints at this time.  Labs generally unremarkable.  The patient continues to endorse SI and remains under IVC.  Consult to TTS place.  The patient can be considered medically cleared at this time.  Home medications were reviewed and there were no home medications to reorder.  Final Clinical Impression(s) / ED Diagnoses Final diagnoses:  Suicidal ideation    Rx / DC Orders ED Discharge Orders     None        Ernie Avena, MD 08/14/20 2056

## 2020-08-14 NOTE — ED Triage Notes (Signed)
Pt BIB [facility]. Pt is IVC'd. Pt threatened to kill herself and began giving her personal items away. She has been depressed for several weeks and expressing negative thoughts about herself. She attempted to sneak away with knives and threatened to cut her wrist.

## 2020-08-14 NOTE — ED Notes (Signed)
Patient was offered something to eat and drink however she declined both.

## 2020-08-14 NOTE — ED Notes (Signed)
From Venda Rodes:  TTS completed. Ailene Rud, NP has accepted Pt to [facility], bed 300-1, under the service of Dr Jola Babinski. AC at [facility] will contact RN when Pt can be transferred.

## 2020-08-14 NOTE — ED Notes (Signed)
Per Venda Rodes: Patient able to be transferred after 1am.

## 2020-08-15 ENCOUNTER — Inpatient Hospital Stay (HOSPITAL_COMMUNITY)
Admission: RE | Admit: 2020-08-15 | Discharge: 2020-08-17 | DRG: 885 | Disposition: A | Discharge status: Home / Self Care | Payer: Medicaid Other | Source: Intra-hospital | Attending: Psychiatry | Admitting: Psychiatry

## 2020-08-15 ENCOUNTER — Encounter (HOSPITAL_COMMUNITY): Payer: Self-pay | Admitting: Nurse Practitioner

## 2020-08-15 DIAGNOSIS — F4310 Post-traumatic stress disorder, unspecified: Secondary | ICD-10-CM | POA: Diagnosis present

## 2020-08-15 DIAGNOSIS — F332 Major depressive disorder, recurrent severe without psychotic features: Secondary | ICD-10-CM | POA: Diagnosis present

## 2020-08-15 DIAGNOSIS — Z79899 Other long term (current) drug therapy: Secondary | ICD-10-CM

## 2020-08-15 DIAGNOSIS — G4700 Insomnia, unspecified: Secondary | ICD-10-CM | POA: Diagnosis present

## 2020-08-15 DIAGNOSIS — Z20822 Contact with and (suspected) exposure to covid-19: Secondary | ICD-10-CM | POA: Diagnosis present

## 2020-08-15 DIAGNOSIS — R45851 Suicidal ideations: Secondary | ICD-10-CM | POA: Diagnosis present

## 2020-08-15 DIAGNOSIS — F410 Panic disorder [episodic paroxysmal anxiety] without agoraphobia: Secondary | ICD-10-CM | POA: Diagnosis present

## 2020-08-15 DIAGNOSIS — Z818 Family history of other mental and behavioral disorders: Secondary | ICD-10-CM

## 2020-08-15 LAB — LIPID PANEL
Cholesterol: 156 mg/dL (ref 0–200)
HDL: 41 mg/dL (ref >40)
LDL Cholesterol: 102 mg/dL — ABNORMAL HIGH (ref 0–99)
Total CHOL/HDL Ratio: 3.8 RATIO
Triglycerides: 67 mg/dL (ref <150)
VLDL: 13 mg/dL (ref 0–40)

## 2020-08-15 LAB — TSH: TSH: 1.478 u[IU]/mL (ref 0.350–4.500)

## 2020-08-15 MED ORDER — TRAZODONE HCL 50 MG PO TABS
50.0000 mg | ORAL_TABLET | Freq: Every evening | ORAL | Status: DC | PRN
  Administered 2020-08-15 – 2020-08-16 (×2): 50 mg via ORAL
  Filled 2020-08-15 (×2): qty 1

## 2020-08-15 MED ORDER — ACETAMINOPHEN 500 MG PO TABS
1000.0000 mg | ORAL_TABLET | Freq: Once | ORAL | Status: AC
  Administered 2020-08-15: 1000 mg via ORAL
  Filled 2020-08-15: qty 2

## 2020-08-15 MED ORDER — ACETAMINOPHEN 325 MG PO TABS: 650.0000 mg | ORAL_TABLET | Freq: Four times a day (QID) | ORAL | Status: DC | PRN

## 2020-08-15 MED ORDER — SERTRALINE HCL 25 MG PO TABS
25.0000 mg | ORAL_TABLET | Freq: Every day | ORAL | Status: DC
  Administered 2020-08-15 – 2020-08-17 (×3): 25 mg via ORAL
  Filled 2020-08-15 (×6): qty 1

## 2020-08-15 MED ORDER — ALUM & MAG HYDROXIDE-SIMETH 200-200-20 MG/5ML PO SUSP: 30.0000 mL | ORAL | Status: DC | PRN

## 2020-08-15 MED ORDER — MAGNESIUM HYDROXIDE 400 MG/5ML PO SUSP: 30.0000 mL | Freq: Every day | ORAL | Status: DC | PRN

## 2020-08-15 MED ORDER — HYDROXYZINE HCL 25 MG PO TABS
25.0000 mg | ORAL_TABLET | Freq: Three times a day (TID) | ORAL | Status: DC | PRN
  Administered 2020-08-16: 25 mg via ORAL
  Filled 2020-08-15 (×2): qty 1

## 2020-08-15 NOTE — Progress Notes (Signed)
The patient rated her day as a 3 out of 10 since her daly was "not okay". She had multiple complaints about the unit regarding visitation. She is bored and would like to go outside and get some fresh air. Her goal for tomorrow is to work on her thoughts and to spend time in her bedroom when its too noisy.

## 2020-08-15 NOTE — ED Notes (Signed)
Sent another faxed copy of the IVC.

## 2020-08-15 NOTE — ED Notes (Signed)
[facility] called for transport 

## 2020-08-15 NOTE — ED Notes (Addendum)
RN asked Dr. Preston Fleeting to complete first exam so patient can be transported to [facility].

## 2020-08-15 NOTE — ED Notes (Signed)
Patient upset that she still is waiting to go to [facility]. RN informed patient that she called to get a transportation update.

## 2020-08-15 NOTE — ED Notes (Signed)
Fransico Michael, RN at [facility] stated she received all the IVC paperwork.

## 2020-08-15 NOTE — ED Notes (Signed)
RN called [facility] to check on the status of [facility] picking the patient up. [facility] stated patient is number 1 on their list and they have been responding to multiple calls in the community but will be here as soon as possible.

## 2020-08-15 NOTE — ED Notes (Signed)
Patients IVC forms faxed to [facility].
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
## 2020-08-15 NOTE — BHH Group Notes (Signed)
Pt did not attend group. 

## 2020-08-15 NOTE — ED Notes (Signed)
Patient complaining of headache. RN getting medication ordered by Melvenia Beam, PA.

## 2020-08-15 NOTE — ED Notes (Signed)
IVC paperwork faxed again.
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
## 2020-08-15 NOTE — Progress Notes (Addendum)
Admission note  35 yr old female was involuntarily admitted to [facility]. Pt present with flat affect and verbalized ongoing depression of 15 years or better. Pt denied a support system and is currently experiencing caregiver role strain.  Pt was alert and oriented x4 during assessment. She denies HI/AVH and pain but endorses current SI, but able to verbally contract for safety during the assessment. Pt signed consents and cooperate with the nursing assessment. VS were taken and WNL. Pt was informed of the IVC process, acute hospitalization stabilization and oriented to the unit. Skin assessment was completed and no significant findings were evident nor was contraband found. Pt belongings were secured in locker 23 by security as witnessed by pt. Pt denies further questions at this time. Will continue to monitor and assess. Safety maintained

## 2020-08-15 NOTE — Progress Notes (Signed)
   08/15/20 2226  Psych Admission Type (Psych Patients Only)  Admission Status Voluntary  Psychosocial Assessment  Patient Complaints Anxiety  Eye Contact Brief  Facial Expression Flat  Affect Appropriate to circumstance  Speech Logical/coherent  Interaction Forwards little  Motor Activity Other (Comment) (WDL)  Appearance/Hygiene Unremarkable  Behavior Characteristics Appropriate to situation  Mood Depressed  Thought Process  Coherency WDL  Content WDL  Delusions None reported or observed  Perception WDL  Hallucination None reported or observed  Judgment Impaired  Confusion None  Danger to Self  Current suicidal ideation? Denies  Self-Injurious Behavior No self-injurious ideation or behavior indicators observed or expressed   Agreement Not to Harm Self Yes  Description of Agreement Verbal  Danger to Others  Danger to Others None reported or observed

## 2020-08-15 NOTE — Progress Notes (Signed)
EKG results placed on the outside of pt's shadow chart  Normal sinus rhythm Normal ECG QT/Qtc  390/414 ms

## 2020-08-15 NOTE — BH Assessment (Signed)
Disposition:   Per shift report and TTS Clinicians note: "Ailene Rud, NP has accepted patient to [facility], bed 300-1. The attending provider is Dr. Jola Babinski. Nurse report [phone].  Requested nursing Sheria Lang, RN) to fax IVC forms to [facility] prior to patient's transport. [phone].
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
## 2020-08-16 NOTE — Progress Notes (Signed)
[facility] MD Progress Note  08/16/2020 1:46 PM Jasmine Ritter  MRN:  [id number] Subjective: Patient is a 35 year old female with a past psychiatric history significant for probable posttraumatic stress disorder, anxiety, depression who presents to the [facility] on 08/05/2020 with suicidal ideation.  The patient had had a recent conflict with her husband, and became quite upset over that, and threatened suicide.  Objective: Patient is seen and examined.  Patient is a 35 year old female with the above-stated past psychiatric history who is seen in follow-up.  She is doing much better today.  She has had no side effects of the medications.  She denied any suicidal ideation today.  She slept fairly well last night.  Culturally she feels somewhat out of place, but is doing her best to cope with the circumstances.  She stated she did talk with her husband last night, and he has agreed to do therapy with her.  It sounds like it was a relatively good conversation.  Her vital signs are stable, she is afebrile.  TS H came back at 1.470.  No other new laboratories.  She slept 6.75 hours last night.  Principal Problem: <principal problem not specified> Diagnosis: Active Problems:   MDD (major depressive disorder), recurrent severe, without psychosis (HCC)  Total Time spent with patient: 20 minutes  Past Psychiatric History: See admission H&P  Past Medical History:  Past Medical History:  Diagnosis Date   ADHD    Anemia     Past Surgical History:  Procedure Laterality Date   NO PAST SURGERIES     Family History:  Family History  Problem Relation Age of Onset   Cancer Mother    Breast cancer Mother    Diabetes Paternal Grandfather    ADD / ADHD Father    Family Psychiatric  History: See admission H&P Social History:  Social History   Substance and Sexual Activity  Alcohol Use Not Currently   Comment: socially     Social History   Substance and Sexual Activity   Drug Use No    Social History   Socioeconomic History   Marital status: Married    Spouse name: Not on file   Number of children: Not on file   Years of education: Not on file   Highest education level: Not on file  Occupational History   Not on file  Tobacco Use   Smoking status: Never   Smokeless tobacco: Never  Vaping Use   Vaping Use: Never used  Substance and Sexual Activity   Alcohol use: Not Currently    Comment: socially   Drug use: No   Sexual activity: Yes    Birth control/protection: None  Other Topics Concern   Not on file  Social History Narrative   Not on file   Social Determinants of Health   Financial Resource Strain: Not on file  Food Insecurity: Not on file  Transportation Needs: Not on file  Physical Activity: Not on file  Stress: Not on file  Social Connections: Not on file   Additional Social History:                         Sleep: Good  Appetite:  Good  Current Medications: Current Facility-Administered Medications  Medication Dose Route Frequency Provider Last Rate Last Admin   acetaminophen (TYLENOL) tablet 650 mg  650 mg Oral Q6H PRN Antonieta Pert, MD       alum & Jodelle Green  hydroxide-simeth (MAALOX/MYLANTA) 200-200-20 MG/5ML suspension 30 mL  30 mL Oral Q4H PRN Antonieta Pert, MD       hydrOXYzine (ATARAX/VISTARIL) tablet 25 mg  25 mg Oral TID PRN Antonieta Pert, MD   25 mg at 08/16/20 1147   magnesium hydroxide (MILK OF MAGNESIA) suspension 30 mL  30 mL Oral Daily PRN Antonieta Pert, MD       sertraline (ZOLOFT) tablet 25 mg  25 mg Oral Daily Antonieta Pert, MD   25 mg at 08/16/20 0900   traZODone (DESYREL) tablet 50 mg  50 mg Oral QHS PRN Antonieta Pert, MD   50 mg at 08/15/20 2130    Lab Results:  Results for orders placed or performed during the hospital encounter of 08/15/20 (from the past 48 hour(s))  Lipid panel     Status: Abnormal   Collection Time: 08/14/20  7:10 PM  Result Value Ref Range    Cholesterol 156 0 - 200 mg/dL   Triglycerides 67 <268 mg/dL   HDL 41 >34 mg/dL   Total CHOL/HDL Ratio 3.8 RATIO   VLDL 13 0 - 40 mg/dL   LDL Cholesterol 196 (H) 0 - 99 mg/dL    Comment:        Total Cholesterol/HDL:CHD Risk Coronary Heart Disease Risk Table                     Men   Women  1/2 Average Risk   3.4   3.3  Average Risk       5.0   4.4  2 X Average Risk   9.6   7.1  3 X Average Risk  23.4   11.0        Use the calculated Patient Ratio above and the CHD Risk Table to determine the patient's CHD Risk.        ATP III CLASSIFICATION (LDL):  <100     mg/dL   Optimal  222-979  mg/dL   Near or Above                    Optimal  130-159  mg/dL   Borderline  892-119  mg/dL   High  >417     mg/dL   Very High Performed at [facility], [address], [place], [place] [address]   TSH     Status: None   Collection Time: 08/15/20  6:13 PM  Result Value Ref Range   TSH 1.478 0.350 - 4.500 uIU/mL    Comment: Performed by a 3rd Generation assay with a functional sensitivity of <=0.01 uIU/mL. Performed at [facility], [address], [place], [place] [address]     Blood Alcohol level:  Lab Results  Component Value Date   [facility] <10 08/14/2020   ETH <5 02/24/2015    Metabolic Disorder Labs: Lab Results  Component Value Date   HGBA1C 5.6 09/10/2019   No results found for: PROLACTIN Lab Results  Component Value Date   CHOL 156 08/14/2020   TRIG 67 08/14/2020   HDL 41 08/14/2020   CHOLHDL 3.8 08/14/2020   VLDL 13 08/14/2020   LDLCALC 102 (H) 08/14/2020    Physical Findings: AIMS:  , ,  ,  ,    CIWA:    COWS:     Musculoskeletal: Strength & Muscle Tone: within normal limits Gait & Station: normal Patient leans: N/A  Psychiatric Specialty Exam:  Presentation  General Appearance:  Fairly Groomed  Eye Contact:Fair  Speech:Normal Rate  Speech Volume:Normal  Handedness:Right   Mood and Affect   Mood:Anxious  Affect:Congruent   Thought Process  Thought Processes:Coherent  Descriptions of Associations:Intact  Orientation:Full (Time, Place and Person)  Thought Content:Logical  History of Schizophrenia/Schizoaffective disorder:No  Duration of Psychotic Symptoms:No data recorded Hallucinations:Hallucinations: None  Ideas of Reference:None  Suicidal Thoughts:Suicidal Thoughts: No  Homicidal Thoughts:Homicidal Thoughts: No   Sensorium  Memory:Immediate Good; Recent Good; Remote Good  Judgment:Fair  Insight:Fair   Executive Functions  Concentration:Good  Attention Span:Good  Recall:Good  Fund of Knowledge:Good  Language:Good   Psychomotor Activity  Psychomotor Activity:Psychomotor Activity: Increased   Assets  Assets:Desire for Improvement; Financial Resources/Insurance; Housing; Physical Health; Resilience; Social Support; Talents/Skills   Sleep  Sleep:Sleep: Fair    Physical Exam: Physical Exam Vitals and nursing note reviewed.  Constitutional:      Appearance: Normal appearance.  HENT:     Head: Normocephalic and atraumatic.  Pulmonary:     Effort: Pulmonary effort is normal.  Neurological:     General: No focal deficit present.     Mental Status: She is alert and oriented to person, place, and time.   Review of Systems  All other systems reviewed and are negative. Blood pressure 120/83, pulse 88, temperature 97.9 F (36.6 C), temperature source Oral, resp. rate 17, height 5\' 3"  (1.6 m), weight 87.1 kg, SpO2 100 %, unknown if currently breastfeeding. Body mass index is 34.01 kg/m.   Treatment Plan Summary: Daily contact with patient to assess and evaluate symptoms and progress in treatment, Medication management, and Plan patient is seen and examined.  Patient is a 35 year old female with the above-stated past psychiatric history who is seen in follow-up.  Diagnosis: 1.  Major depression, recurrent, severe without psychotic  features 2.  Posttraumatic stress disorder  Pertinent findings on examination today: 1.  Significant improvement in mood and affect. 2.  Mild nausea with the SSRI. 3.  Sleep is good. 4.  Patient has spoken with her husband and that conversation apparently went well.  Plan: 1.  Continue hydroxyzine 25 mg p.o. 3 times daily as needed anxiety. 2.  Continue Zoloft 25 mg p.o. daily for anxiety and depression. 3.  Continue trazodone 50 mg p.o. nightly as needed insomnia. 4.  Disposition planning-I would anticipate discharge in 1 to 2 days.  36, MD 08/16/2020, 1:46 PM
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
## 2020-08-16 NOTE — Progress Notes (Addendum)
   08/16/20 1200  Psych Admission Type (Psych Patients Only)  Admission Status Voluntary  Psychosocial Assessment  Patient Complaints Anxiety  Eye Contact Brief  Facial Expression Flat  Affect Appropriate to circumstance  Speech Logical/coherent  Interaction Assertive  Motor Activity Other (Comment) (WDL)  Appearance/Hygiene Unremarkable  Behavior Characteristics Cooperative;Anxious  Mood Anxious;Pleasant  Aggressive Behavior  Targets Self  Type of Behavior Unprovoked  Effect No apparent injury  Thought Process  Coherency WDL  Content WDL  Delusions None reported or observed  Perception WDL  Hallucination None reported or observed  Judgment Impaired  Confusion None  Danger to Self  Current suicidal ideation? Denies  Self-Injurious Behavior No self-injurious ideation or behavior indicators observed or expressed   Agreement Not to Harm Self Yes  Description of Agreement Verbal  Danger to Others  Danger to Others None reported or observed   D. Pt presented with an anxious affect/ mood- has been friendly during interactions- per pt's self inventory, pt rated her depression, hopelessness and anxiety a 4/6/4, respectively. Pt reported that her goal to work on today was "planning how to give myself achievable goals to look forward to and be happy". Pt currently denies SI/HI and AVH A. Labs and vitals monitored. Pt given and educated on medications. Pt supported emotionally and encouraged to express concerns and ask questions.   R. Pt remains safe with 15 minute checks. Will continue POC.

## 2020-08-16 NOTE — Progress Notes (Signed)
   08/16/20 2205  Psych Admission Type (Psych Patients Only)  Admission Status Voluntary  Psychosocial Assessment  Patient Complaints Insomnia;Anxiety  Eye Contact Brief  Facial Expression Flat  Affect Appropriate to circumstance  Speech Logical/coherent  Interaction Assertive  Motor Activity Other (Comment) (WDL)  Appearance/Hygiene Unremarkable  Behavior Characteristics Appropriate to situation  Mood Anxious;Pleasant  Thought Process  Coherency WDL  Content WDL  Delusions None reported or observed  Perception WDL  Hallucination None reported or observed  Judgment Impaired  Confusion None  Danger to Self  Current suicidal ideation? Denies  Self-Injurious Behavior No self-injurious ideation or behavior indicators observed or expressed   Agreement Not to Harm Self Yes  Description of Agreement Verbal  Danger to Others  Danger to Others None reported or observed

## 2020-08-16 NOTE — Progress Notes (Signed)
[facility] Group Notes:  (Nursing/MHT/Case Management/Adjunct)  Date:  08/16/2020  Time:  2000  Type of Therapy:   wrap up group  Participation Level:  Active  Participation Quality:  Appropriate, Attentive, Sharing, and Supportive  Affect:  Depressed  Cognitive:  Appropriate  Insight:  Improving  Engagement in Group:  Engaged  Modes of Intervention:  Clarification, Education, and Support  Summary of Progress/Problems: Positive thinking and positive change were discussed.   Marcille Buffy 08/16/2020, 8:39 PM

## 2020-08-17 DIAGNOSIS — F332 Major depressive disorder, recurrent severe without psychotic features: Principal | ICD-10-CM

## 2020-08-17 LAB — HEMOGLOBIN A1C
Hgb A1c MFr Bld: 5.5 % (ref 4.8–5.6)
Mean Plasma Glucose: 111 mg/dL

## 2020-08-17 MED ORDER — SERTRALINE HCL 25 MG PO TABS: 25.0000 mg | ORAL_TABLET | Freq: Every day | ORAL | 0 refills | Status: DC

## 2020-08-17 NOTE — Tx Team (Signed)
Interdisciplinary Treatment and Diagnostic Plan Update  08/17/2020 Time of Session: 9:00am Jasmine Ritter MRN: [id number]  Principal Diagnosis: <principal problem not specified>  Secondary Diagnoses: Active Problems:   MDD (major depressive disorder), recurrent severe, without psychosis (HCC)   Current Medications:  Current Facility-Administered Medications  Medication Dose Route Frequency Provider Last Rate Last Admin   acetaminophen (TYLENOL) tablet 650 mg  650 mg Oral Q6H PRN Antonieta Pert, MD       alum & mag hydroxide-simeth (MAALOX/MYLANTA) 200-200-20 MG/5ML suspension 30 mL  30 mL Oral Q4H PRN Antonieta Pert, MD       hydrOXYzine (ATARAX/VISTARIL) tablet 25 mg  25 mg Oral TID PRN Antonieta Pert, MD   25 mg at 08/16/20 1147   magnesium hydroxide (MILK OF MAGNESIA) suspension 30 mL  30 mL Oral Daily PRN Antonieta Pert, MD       sertraline (ZOLOFT) tablet 25 mg  25 mg Oral Daily Antonieta Pert, MD   25 mg at 08/17/20 0802   traZODone (DESYREL) tablet 50 mg  50 mg Oral QHS PRN Antonieta Pert, MD   50 mg at 08/16/20 2121   PTA Medications: No medications prior to admission.    Patient Stressors:    Patient Strengths:    Treatment Modalities: Medication Management, Group therapy, Case management,  1 to 1 session with clinician, Psychoeducation, Recreational therapy.   Physician Treatment Plan for Primary Diagnosis: <principal problem not specified> Long Term Goal(s): Improvement in symptoms so as ready for discharge   Short Term Goals: Ability to identify changes in lifestyle to reduce recurrence of condition will improve Ability to verbalize feelings will improve Ability to disclose and discuss suicidal ideas Ability to demonstrate self-control will improve Ability to identify and develop effective coping behaviors will improve Ability to maintain clinical measurements within normal limits will improve  Medication Management: Evaluate patient's  response, side effects, and tolerance of medication regimen.  Therapeutic Interventions: 1 to 1 sessions, Unit Group sessions and Medication administration.  Evaluation of Outcomes: Adequate for Discharge  Physician Treatment Plan for Secondary Diagnosis: Active Problems:   MDD (major depressive disorder), recurrent severe, without psychosis (HCC)  Long Term Goal(s): Improvement in symptoms so as ready for discharge   Short Term Goals: Ability to identify changes in lifestyle to reduce recurrence of condition will improve Ability to verbalize feelings will improve Ability to disclose and discuss suicidal ideas Ability to demonstrate self-control will improve Ability to identify and develop effective coping behaviors will improve Ability to maintain clinical measurements within normal limits will improve     Medication Management: Evaluate patient's response, side effects, and tolerance of medication regimen.  Therapeutic Interventions: 1 to 1 sessions, Unit Group sessions and Medication administration.  Evaluation of Outcomes: Adequate for Discharge   RN Treatment Plan for Primary Diagnosis: <principal problem not specified> Long Term Goal(s): Knowledge of disease and therapeutic regimen to maintain health will improve  Short Term Goals: Ability to remain free from injury will improve, Ability to verbalize frustration and anger appropriately will improve, Ability to identify and develop effective coping behaviors will improve, and Compliance with prescribed medications will improve  Medication Management: RN will administer medications as ordered by provider, will assess and evaluate patient's response and provide education to patient for prescribed medication. RN will report any adverse and/or side effects to prescribing provider.  Therapeutic Interventions: 1 on 1 counseling sessions, Psychoeducation, Medication administration, Evaluate responses to treatment, Monitor vital signs  and CBGs  as ordered, Perform/monitor CIWA, COWS, AIMS and Fall Risk screenings as ordered, Perform wound care treatments as ordered.  Evaluation of Outcomes: Adequate for Discharge   LCSW Treatment Plan for Primary Diagnosis: <principal problem not specified> Long Term Goal(s): Safe transition to appropriate next level of care at discharge, Engage patient in therapeutic group addressing interpersonal concerns.  Short Term Goals: Engage patient in aftercare planning with referrals and resources, Increase social support, Increase ability to appropriately verbalize feelings, Identify triggers associated with mental health/substance abuse issues, and Increase skills for wellness and recovery  Therapeutic Interventions: Assess for all discharge needs, 1 to 1 time with Social worker, Explore available resources and support systems, Assess for adequacy in community support network, Educate family and significant other(s) on suicide prevention, Complete Psychosocial Assessment, Interpersonal group therapy.  Evaluation of Outcomes: Adequate for Discharge   Progress in Treatment: Attending groups: Yes. Participating in groups: Yes. Taking medication as prescribed: Yes. Toleration medication: Yes. Family/Significant other contact made: Yes, individual(s) contacted:  husband Patient understands diagnosis: Yes. Discussing patient identified problems/goals with staff: Yes. Medical problems stabilized or resolved: Yes. Denies suicidal/homicidal ideation: Yes. Issues/concerns per patient self-inventory: No.   New problem(s) identified: No, Describe:  none  New Short Term/Long Term Goal(s): medication stabilization, elimination of SI thoughts, development of comprehensive mental wellness plan.    Patient Goals:  "To not feel the same as I was"  Discharge Plan or Barriers: Pt is to return home and is to follow up with outpatient provider for individual and couples counseling   Reason for  Continuation of Hospitalization: Medication stabilization  Estimated Length of Stay: Adequate for Discharge  Attendees: Patient: Jasmine Ritter 08/17/2020   Physician: Arna Snipe, DO 08/17/2020   Nursing:  08/17/2020  RN Care Manager: 08/17/2020   Social Worker: Ruthann Cancer, LCSW 08/17/2020   Recreational Therapist:  08/17/2020   Other:  08/17/2020   Other:  08/17/2020   Other: 08/17/2020    Scribe for Treatment Team: Otelia Santee, LCSW 08/17/2020 11:00 AM

## 2020-08-17 NOTE — Progress Notes (Signed)
  [facility] Adult Case Management Discharge Plan :  Will you be returning to the same living situation after discharge:  Yes,  staying with husband At discharge, do you have transportation home?: Yes,  husband can pick patient up Do you have the ability to pay for your medications: Yes,  insurance  Release of information consent forms completed and in the chart;  Patient's signature needed at discharge.  Patient to Follow up at:  Follow-up Information     [facility]. Go to.   Specialty: Behavioral Health Why: Please go to this provider for therapy and medication management services during walk in hours:  Monday through Wednesday from 8:00 am to 11:00 am. Contact information: [address] [place] [address] [phone]        [facility]. Call.   Why: Call for more information on how to get connected with relationship/couples counseling. Contact information: [address] [place], [place] [address] [phone]        [facility]. Call.   Why: Call to learn more about couples counseling and set up an appointment. They offer sliding scale fees and are willing to work with your finances. Contact information: [address] [place], [place] [address] [phone]                Next level of care provider has access to [facility] Link:yes  Safety Planning and Suicide Prevention discussed: Yes,  husband, Sadee Osland     Has patient been referred to the Quitline?: N/A patient is not a smoker  Patient has been referred for addiction treatment: N/A  Zykeriah Mathia E Sidharth Leverette, LCSW 08/17/2020, 10:02 AM

## 2020-08-17 NOTE — [facility] Group Notes (Signed)
Patient did not attend group.  Spiritual care group on grief and loss facilitated by chaplain Katy Dvid Pendry, B[facility]   Group Goal:   Support / Education around grief and loss   Members engage in facilitated group support and psycho-social education.   Group Description:   Following introductions and group rules, group members engaged in facilitated group dialog and support around topic of loss, with particular support around experiences of loss in their lives. Group Identified types of loss (relationships / self / things) and identified patterns, circumstances, and changes that precipitate losses. Reflected on thoughts / feelings around loss, normalized grief responses, and recognized variety in grief experience. Group noted Worden's four tasks of grief in discussion.   Group drew on Adlerian / Rogerian, narrative, MI,   Patient Progress:  

## 2020-08-17 NOTE — Progress Notes (Signed)
Recreation Therapy Notes  Date:  8.1.22 Time: 0930 Location: [address] Dayroom  Group Topic: Stress Management  Goal Area(s) Addresses:  Patient will identify positive stress management techniques. Patient will identify benefits of using stress management post d/c.  Behavioral Response: Appropriate  Intervention: Stress Management  Activity :  Meditation.  LRT played a meditation that focused on challenging negative thoughts.  Patients were to listen and follow along as meditation was played to fully engaged in activity.  Education:  Stress Management, Discharge Planning.   Education Outcome: Acknowledges Education  Clinical Observations/Feedback: Pt attended and participated in group.  Pt had no concerns.    [name], LRT/CTRS     [name], [name] A 08/17/2020 11:13 AM

## 2020-08-17 NOTE — Plan of Care (Signed)
  Problem: Education: Goal: Knowledge of General Education information will improve Description: Including pain rating scale, medication(s)/side effects and non-pharmacologic comfort measures Outcome: Completed/Met   Problem: Education: Goal: Ability to make informed decisions regarding treatment will improve Outcome: Completed/Met   Problem: Coping: Goal: Coping ability will improve Outcome: Completed/Met   Problem: Health Behavior/Discharge Planning: Goal: Identification of resources available to assist in meeting health care needs will improve Outcome: Completed/Met   Problem: Medication: Goal: Compliance with prescribed medication regimen will improve Outcome: Completed/Met

## 2020-08-17 NOTE — Progress Notes (Signed)
D: Patient verbalizes readiness for discharge. Denies suicidal and homicidal ideations. Denies auditory and visual hallucinations.  No complaints of pain.  A:  Patient receptive to discharge instructions. Questions encouraged, pt verbalizes understanding.  R:  Escorted to the lobby by this RN.   

## 2020-08-17 NOTE — [facility] Suicide Risk Assessment (Signed)
[facility] INPATIENT:  Family/Significant Other Suicide Prevention Education  Suicide Prevention Education:  Education Completed; Donnetta Hail, Husband  (name of family member/significant other) has been identified by the patient as the family member/significant other with whom the patient will be residing, and identified as the person(s) who will aid the patient in the event of a mental health crisis (suicidal ideations/suicide attempt).  With written consent from the patient, the family member/significant other has been provided the following suicide prevention education, prior to the and/or following the discharge of the patient.  Husband reports that patient has been very depressed and has had a hard time with the kids.  Husband reports understanding of suicide prevention and reports that he will be able to pick patient up. Husband reports that he went to the hospital several years ago.  CSW answered all questions around follow up treatment.  Residence is safe and husband agreed to monitor medications and lock in lock box. Husband also confirmed no guns/firearms in the residence.    The suicide prevention education provided includes the following: Suicide risk factors Suicide prevention and interventions National Suicide Hotline telephone number [facility] assessment telephone number [facility] Emergency Assistance 911 [facility] and/or Residential Mobile Crisis Unit telephone number  Request made of family/significant other to: Remove weapons (e.g., guns, rifles, knives), all items previously/currently identified as safety concern.   Remove drugs/medications (over-the-counter, prescriptions, illicit drugs), all items previously/currently identified as a safety concern.  The family member/significant other verbalizes understanding of the suicide prevention education information provided.  The family member/significant other agrees to remove the items of safety concern listed  above.  Dinah Lupa E Kyah Buesing 08/17/2020, 10:31 AM

## 2020-08-17 NOTE — [facility] Suicide Risk Assessment (Signed)
[facility] Discharge Suicide Risk Assessment   Principal Problem: <principal problem not specified> Discharge Diagnoses: Active Problems:   MDD (major depressive disorder), recurrent severe, without psychosis (HCC)   Total Time spent with patient: 20 minutes  Musculoskeletal: Strength & Muscle Tone: within normal limits Gait & Station: normal Patient leans: N/A  Psychiatric Specialty Exam  Presentation  General Appearance: Fairly Groomed  Eye Contact:Fair  Speech:Normal Rate  Speech Volume:Normal  Handedness:Right   Mood and Affect  Mood:Anxious  Duration of Depression Symptoms: Greater than two weeks  Affect:Congruent   Thought Process  Thought Processes:Coherent  Descriptions of Associations:Intact  Orientation:Full (Time, Place and Person)  Thought Content:Logical  History of Schizophrenia/Schizoaffective disorder:No  Duration of Psychotic Symptoms:No data recorded Hallucinations:No data recorded Ideas of Reference:None  Suicidal Thoughts:No data recorded Homicidal Thoughts:No data recorded  Sensorium  Memory:Immediate Good; Recent Good; Remote Good  Judgment:Fair  Insight:Fair   Executive Functions  Concentration:Good  Attention Span:Good  Recall:Good  Fund of Knowledge:Good  Language:Good   Psychomotor Activity  Psychomotor Activity: No data recorded  Assets  Assets:Desire for Improvement; Financial Resources/Insurance; Housing; Physical Health; Resilience; Social Support; Talents/Skills   Sleep  Sleep: No data recorded  Physical Exam: Physical Exam Vitals and nursing note reviewed.  Constitutional:      Appearance: Normal appearance.  HENT:     Head: Normocephalic and atraumatic.  Pulmonary:     Effort: Pulmonary effort is normal.  Neurological:     General: No focal deficit present.     Mental Status: She is alert and oriented to person, place, and time.   Review of Systems  All other systems reviewed and are  negative. Blood pressure 131/89, pulse 77, temperature 98 F (36.7 C), temperature source Oral, resp. rate 18, height 5\' 3"  (1.6 m), weight 87.1 kg, SpO2 100 %, unknown if currently breastfeeding. Body mass index is 34.01 kg/m.  Mental Status Per Nursing Assessment::   On Admission:  Suicidal ideation indicated by patient  Demographic Factors:  Caucasian and Unemployed  Loss Factors: NA  Historical Factors: Impulsivity  Risk Reduction Factors:   Responsible for children under 44 years of age, Sense of responsibility to family, and Living with another person, especially a relative  Continued Clinical Symptoms:  Depression:   Impulsivity  Cognitive Features That Contribute To Risk:  None    Suicide Risk:  Minimal: No identifiable suicidal ideation.  Patients presenting with no risk factors but with morbid ruminations; may be classified as minimal risk based on the severity of the depressive symptoms   Follow-up Information     [facility]. Go to.   Specialty: Behavioral Health Why: Please go to this provider for therapy and medication management services during walk in hours:  Monday through Wednesday from 8:00 am to 11:00 am. Contact information: [address] [place] [phone]                Plan Of Care/Follow-up recommendations:  Activity:  ad lib  [phone], MD 08/17/2020, 9:23 AM
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
## 2020-08-17 NOTE — [facility] Group Notes (Signed)
Therapy Type: Group Therapy  Participation Level:  Did Not Attend   Patients received a worksheet with an outline of 2 gingerbread men with a separation in the middle of the page. One sign designated what the pt sees about themselves and the other is what others see. Pts were asked to introduce themselves and share something they like about themself. Pts were then asked to draw, write or color how they view themselves as well as how they are viewed by others. CSW led discussion about the feelings and words associated with each side.   Patient Summary:  Pt did not attend.   Marquon Alcala, LCSWA Clinicial Social Worker [facility]  

## 2023-08-24 ENCOUNTER — Observation Stay (HOSPITAL_COMMUNITY)
Admission: EM | Admit: 2023-08-24 | Discharge: 2023-08-25 | Disposition: A | Discharge status: Home / Self Care | Attending: Internal Medicine | Admitting: Internal Medicine

## 2023-08-24 ENCOUNTER — Other Ambulatory Visit: Payer: Self-pay | Admitting: Obstetrics and Gynecology

## 2023-08-24 ENCOUNTER — Encounter (HOSPITAL_COMMUNITY): Payer: Self-pay

## 2023-08-24 ENCOUNTER — Other Ambulatory Visit: Payer: Self-pay

## 2023-08-24 ENCOUNTER — Emergency Department (HOSPITAL_COMMUNITY)

## 2023-08-24 DIAGNOSIS — R55 Syncope and collapse: Secondary | ICD-10-CM | POA: Diagnosis present

## 2023-08-24 DIAGNOSIS — D649 Anemia, unspecified: Secondary | ICD-10-CM | POA: Diagnosis not present

## 2023-08-24 DIAGNOSIS — N938 Other specified abnormal uterine and vaginal bleeding: Secondary | ICD-10-CM

## 2023-08-24 LAB — BASIC METABOLIC PANEL WITH GFR
Anion gap: 10 (ref 5–15)
BUN: 8 mg/dL (ref 6–20)
CO2: 22 mmol/L (ref 22–32)
Calcium: 8.8 mg/dL — ABNORMAL LOW (ref 8.9–10.3)
Chloride: 106 mmol/L (ref 98–111)
Creatinine, Ser: 0.87 mg/dL (ref 0.44–1.00)
GFR, Estimated: >60 mL/min (ref >60)
Glucose, Bld: 112 mg/dL — ABNORMAL HIGH (ref 70–99)
Potassium: 3.9 mmol/L (ref 3.5–5.1)
Sodium: 138 mmol/L (ref 135–145)

## 2023-08-24 LAB — WET PREP, GENITAL
Clue Cells Wet Prep HPF POC: NONE SEEN
Sperm: NONE SEEN
Trich, Wet Prep: NONE SEEN
WBC, Wet Prep HPF POC: <10 (ref <10)
Yeast Wet Prep HPF POC: NONE SEEN

## 2023-08-24 LAB — RETICULOCYTES
Immature Retic Fract: 46.3 % — ABNORMAL HIGH (ref 2.3–15.9)
RBC.: 2.41 MIL/uL — ABNORMAL LOW (ref 3.87–5.11)
Retic Count, Absolute: 110.1 K/uL (ref 19.0–186.0)
Retic Ct Pct: 4.6 % — ABNORMAL HIGH (ref 0.4–3.1)

## 2023-08-24 LAB — FERRITIN: Ferritin: 8 ng/mL — ABNORMAL LOW (ref 11–307)

## 2023-08-24 LAB — CBC
HCT: 20.9 % — ABNORMAL LOW (ref 36.0–46.0)
Hemoglobin: 6 g/dL — CL (ref 12.0–15.0)
MCH: 24 pg — ABNORMAL LOW (ref 26.0–34.0)
MCHC: 28.7 g/dL — ABNORMAL LOW (ref 30.0–36.0)
MCV: 83.6 fL (ref 80.0–100.0)
Platelets: 230 K/uL (ref 150–400)
RBC: 2.5 MIL/uL — ABNORMAL LOW (ref 3.87–5.11)
RDW: 17.6 % — ABNORMAL HIGH (ref 11.5–15.5)
WBC: 6.2 K/uL (ref 4.0–10.5)
nRBC: 0 % (ref 0.0–0.2)

## 2023-08-24 LAB — IRON AND TIBC
Iron: 13 ug/dL — ABNORMAL LOW (ref 28–170)
Saturation Ratios: 4 % — ABNORMAL LOW (ref 10.4–31.8)
TIBC: 358 ug/dL (ref 250–450)
UIBC: 345 ug/dL

## 2023-08-24 LAB — PREPARE RBC (CROSSMATCH)

## 2023-08-24 LAB — FOLATE: Folate: 20.4 ng/mL (ref >5.9)

## 2023-08-24 LAB — HIV ANTIBODY (ROUTINE TESTING W REFLEX): HIV Screen 4th Generation wRfx: NONREACTIVE

## 2023-08-24 LAB — VITAMIN B12: Vitamin B-12: 180 pg/mL (ref 180–914)

## 2023-08-24 LAB — HCG, SERUM, QUALITATIVE: Preg, Serum: NEGATIVE

## 2023-08-24 MED ORDER — FERROUS SULFATE 325 (65 FE) MG PO TABS
325.0000 mg | ORAL_TABLET | Freq: Every day | ORAL | Status: DC
  Administered 2023-08-25: 325 mg via ORAL
  Filled 2023-08-24: qty 1

## 2023-08-24 MED ORDER — SODIUM CHLORIDE 0.9% IV SOLUTION: Freq: Once | INTRAVENOUS | Status: AC

## 2023-08-24 MED ORDER — NORETHINDRONE ACETATE 5 MG PO TABS
5.0000 mg | ORAL_TABLET | Freq: Every day | ORAL | Status: DC
  Administered 2023-08-24 – 2023-08-25 (×2): 5 mg via ORAL
  Filled 2023-08-24 (×2): qty 1

## 2023-08-24 NOTE — ED Notes (Signed)
 15 min post transfusion start

## 2023-08-24 NOTE — Plan of Care (Signed)
   Problem: Education: Goal: Knowledge of General Education information will improve Description Including pain rating scale, medication(s)/side effects and non-pharmacologic comfort measures Outcome: Progressing   Problem: Health Behavior/Discharge Planning: Goal: Ability to manage health-related needs will improve Outcome: Progressing

## 2023-08-24 NOTE — ED Triage Notes (Signed)
 Pt coming from home, reports having period for 2 weeks now, passing golf ball sized blood clots, and pt also reports passing out 3x yesterday and feeling fatigued, dizzy, and lightheaded.

## 2023-08-24 NOTE — Progress Notes (Signed)
 Patient first blood transfusion is complete took vitals at 1850 and documented. Patients is resting comfortably in bed. Patient used bathroom statedno clots in urine this time Bed is in lowest position and patients needs are met at this time. Patient is waiting for second unit of blood from blood bank. Jasmine Evert RN

## 2023-08-24 NOTE — ED Notes (Signed)
Blood transfusion start

## 2023-08-24 NOTE — ED Provider Notes (Signed)
 [place] EMERGENCY DEPARTMENT AT [facility] Provider Note   CSN: [id number] Arrival date & time: 08/24/23  1123     Patient presents with: Vaginal Bleeding and Loss of Consciousness   Jasmine Ritter is a 38 y.o. female.   HPI Patient reports that she has been getting Depo-Provera shots for birth control.  Her last Depo Provera shot was in June.  She reports she is coming close to being due for August.  The patient reports that since earlier this year when she started getting Depo shots, she has had vaginal bleeding.  At times it is spotting but other times she will start her menstrual cycle and its pretty heavy with large clot passage.  She reports before this month, the heavy bleeding was start to resolve after about 3 days.  However, this time, she has had approximately 7 days now of heavy bleeding with large golf ball size clots.  Patient denies having significant amount of pelvic pain.  She reports she has had some mild cramping but not severe.  Patient reports she has started however to get very lightheaded and feels her heart racing when she gets up to do any kind of activities.  Last night she had several episodes of near syncope or syncope.  These occurred when she was getting up and trying to go to the bathroom.  Ultimately this morning when she had a third episode, she came to the emergency department for further evaluation.  Patient denies ever having had significant anemia to the point of needing a blood transfusion..    Prior to Admission medications   Medication Sig Start Date End Date Taking? Authorizing Provider  ferrous sulfate  324 MG TBEC Take 324 mg by mouth.   Yes [provider]  medroxyPROGESTERone Acetate 150 MG/ML SUSY Inject 1 mL into the muscle every 3 (three) months. 06/12/23  Yes [provider]    Allergies: Patient has no known allergies.    Review of Systems  Updated Vital Signs BP 114/75   Pulse 97   Temp 98.7 F (37.1 C)  (Oral)   Resp 16   Ht 5' 3 (1.6 m)   Wt 92.1 kg   LMP 08/24/2023 (Exact Date)   SpO2 100%   BMI 35.96 kg/m   Physical Exam Constitutional:      Comments: Alert nontoxic clear mental status.  No respiratory distress.  HENT:     Head: Normocephalic and atraumatic.     Mouth/Throat:     Pharynx: Oropharynx is clear.  Eyes:     Extraocular Movements: Extraocular movements intact.  Cardiovascular:     Rate and Rhythm: Regular rhythm. Tachycardia present.     Heart sounds: Normal heart sounds.  Pulmonary:     Effort: Pulmonary effort is normal.     Breath sounds: Normal breath sounds.  Abdominal:     General: There is no distension.     Palpations: Abdomen is soft.     Tenderness: There is no abdominal tenderness. There is no guarding.  Genitourinary:    Comments: Moderate blood pool in vaginal vault with clot.  No brisk bleeding Musculoskeletal:        General: No swelling or tenderness. Normal range of motion.     Cervical back: Neck supple.     Right lower leg: No edema.     Left lower leg: No edema.  Skin:    General: Skin is warm and dry.     Coloration: Skin is pale.  Neurological:     General: No focal deficit present.     Mental Status: She is oriented to person, place, and time.     Motor: No weakness.     Coordination: Coordination normal.  Psychiatric:        Mood and Affect: Mood normal.     (all labs ordered are listed, but only abnormal results are displayed) Labs Reviewed  CBC - Abnormal; Notable for the following components:      Result Value   RBC 2.50 (*)    Hemoglobin 6.0 (*)    HCT 20.9 (*)    MCH 24.0 (*)    MCHC 28.7 (*)    RDW 17.6 (*)    All other components within normal limits  BASIC METABOLIC PANEL WITH GFR - Abnormal; Notable for the following components:   Glucose, Bld 112 (*)    Calcium 8.8 (*)    All other components within normal limits  WET PREP, GENITAL  HCG, SERUM, QUALITATIVE  RPR  HIV ANTIBODY (ROUTINE TESTING W REFLEX)   PREPARE RBC (CROSSMATCH)  TYPE AND SCREEN  GC/CHLAMYDIA PROBE AMP ([place]) NOT AT [facility]    EKG: None  Radiology: US  PELVIC COMPLETE WITH TRANSVAGINAL Result Date: 08/24/2023 CLINICAL DATA:  13843 Dysfunctional uterine bleeding 13843 EXAM: TRANSABDOMINAL AND TRANSVAGINAL ULTRASOUND OF PELVIS TECHNIQUE: Both transabdominal and transvaginal ultrasound examinations of the pelvis were performed. Transabdominal technique was performed for global imaging of the pelvis including uterus, ovaries, adnexal regions, and pelvic cul-de-sac. It was necessary to proceed with endovaginal exam following the transabdominal exam to visualize the uterus, endometrium, and ovaries. COMPARISON:  None Available. FINDINGS: Uterus Measurements: 10.9 x 4 x 5.6 cm = volume: 127 mL. No fibroids or other mass visualized. Endometrium Thickness: 4 mm.  No focal abnormality visualized. Right ovary Measurements: 3.3 x 2.6 x 2.4 cm = volume: 10.6 mL. Normal appearance/no adnexal mass. Left ovary Measurements: 3.3 x 2.2 x 2.7 cm = volume: 10.6 mL. Normal appearance/no adnexal mass. Other findings No free pelvic fluid. IMPRESSION: Normal pelvic ultrasound. Electronically Signed   By: Rogelia Myers M.D.   On: 08/24/2023 15:23     Procedures  CRITICAL CARE Performed by: Ludivina Shines   Total critical care time: 30 minutes  Critical care time was exclusive of separately billable procedures and treating other patients.  Critical care was necessary to treat or prevent imminent or life-threatening deterioration.  Critical care was time spent personally by me on the following activities: development of treatment plan with patient and/or surrogate as well as nursing, discussions with consultants, evaluation of patient's response to treatment, examination of patient, obtaining history from patient or surrogate, ordering and performing treatments and interventions, ordering and review of laboratory studies, ordering and review of  radiographic studies, pulse oximetry and re-evaluation of patient's condition.  Medications Ordered in the ED  0.9 %  sodium chloride  infusion (Manually program via Guardrails IV Fluids) (has no administration in time range)  norethindrone  (AYGESTIN ) tablet 5 mg (5 mg Oral Given by Other 08/24/23 1520)                                    Medical Decision Making Amount and/or Complexity of Data Reviewed Labs: ordered. Radiology: ordered.  Risk Prescription drug management. Decision regarding hospitalization.  Patient presents as outlined.  Blood pressures are normotensive at rest at 129/86.  Patient is tachycardic at rest.  Patient  describes several syncope or near syncope episodes last night.  She has had heavy vaginal bleeding.  At this time findings consistent with symptomatic anemia.  hCG negative.  White count 6.2.  Platelets 230.  Onset: Reviewed with GYN MA[facility] provider Dr. Garen Ban.  At this time recommends starting Aygestin  5 mg daily and proceeding with pelvic ultrasound.  Admit for symptomatic anemia and GYN will consult at [facility].  Consult: [facility] hospitalist for admission.     Final diagnoses:  Symptomatic anemia  DUB (dysfunctional uterine bleeding)    ED Discharge Orders     None          Armenta Canning, MD 08/24/23 1642

## 2023-08-24 NOTE — H&P (Signed)
 History and Physical    Jasmine Ritter FMW:[id number] DOB: [birth year] DOA: 08/24/2023  PCP: [facility] Patient coming from: Home  Chief Complaint:   HPI: Jasmine Ritter is a 38 y.o. female with medical history significant of anemia, iron  major depression, admitted with complaints of near syncope/syncope x 3.  She has been having heavy bleeding for the last 7 days she has she thought it would go away on its own she had golf ball sized blood clots and with any simple activity she was getting short of breath and tachypneic and she felt her heart was racing.  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  She has never been hospitalized prior to this for the same reason.  She does complain of nausea but no vomiting.  She has diarrhea and constipation alternating she is due to have a GI workup done at [facility].  She denied fever chills chest pain.  She does complain of exertional dyspnea and palpitations.  No history of CAD.  No smoking.  No abdominal pain or urinary complaints.  In the ER she had a pelvic ultrasound which was reported as no acute abnormality, hemoglobin was 6.02 units of blood transfusion was ordered in the ED.  ED physician discussed with Dr. Blinda and she was started on norethindrone , she received IV fluids in the ED. Past Medical History:  Diagnosis Date   ADHD    Anemia     Past Surgical History:  Procedure Laterality Date   NO PAST SURGERIES      Social History   Socioeconomic History   Marital status: Married    Spouse name: Not on file   Number of children: Not on file   Years of education: Not on file   Highest education level: Not on file  Occupational History   Not on file  Tobacco Use   Smoking status: Never   Smokeless tobacco: Never  Vaping Use   Vaping status: Never Used  Substance and Sexual Activity   Alcohol use: Not Currently    Comment: socially   Drug use: No   Sexual activity: Yes    Birth  control/protection: None  Other Topics Concern   Not on file  Social History Narrative   Not on file   Social Drivers of Health   Financial Resource Strain: Low Risk  (10/24/2017)   Overall Financial Resource Strain (CARDIA)    Difficulty of Paying Living Expenses: Not hard at all  Food Insecurity: No Food Insecurity (02/21/2019)   Hunger Vital Sign    Worried About Running Out of Food in the Last Year: Never true    Ran Out of Food in the Last Year: Never true  Transportation Needs: No Transportation Needs (02/21/2019)   PRAPARE - Administrator, Civil Service (Medical): No    Lack of Transportation (Non-Medical): No  Physical Activity: Not on file  Stress: Not on file  Social Connections: Not on file  Intimate Partner Violence: Not At Risk (10/24/2017)   Humiliation, Afraid, Rape, and Kick questionnaire    Fear of Current or Ex-Partner: No    Emotionally Abused: No    Physically Abused: No    Sexually Abused: No    No Known Allergies  Family History  Problem Relation Age of Onset   Cancer Mother    Breast cancer Mother    Diabetes Paternal Grandfather    ADD / ADHD Father  Prior to Admission medications   Medication Sig Start Date End Date Taking? Authorizing Provider  ferrous sulfate  324 MG TBEC Take 324 mg by mouth.   Yes [provider]  medroxyPROGESTERone Acetate 150 MG/ML SUSY Inject 1 mL into the muscle every 3 (three) months. 06/12/23  Yes [provider]    Physical Exam: Vitals:   08/24/23 1400 08/24/23 1531 08/24/23 1550 08/24/23 1600  BP: 113/74  114/75 113/62  Pulse: 70 95 97 92  Resp: 18  16   Temp:  98.3 F (36.8 C) 98.7 F (37.1 C)   TempSrc:  Oral Oral   SpO2: 100% 100% 100% 99%  Weight:      Height:         General:  Appears in no acute distress Eyes: Pale  Cardiovascular: Tachycardic Respiratory:  CTA bilaterally, no w/r/r. Normal respiratory effort. Abdomen:  soft, mild diffuse tenderness Psychiatric:   grossly normal mood and affect, speech fluent and appropriate, AOx3 Neurologic:  CN 2-12 grossly intact, moves all extremities in coordinated fashion, sensation intact  Labs on Admission: I have personally reviewed following labs and imaging studies  CBC: Recent Labs  Lab 08/24/23 1134  WBC 6.2  HGB 6.0*  HCT 20.9*  MCV 83.6  PLT 230   Basic Metabolic Panel: Recent Labs  Lab 08/24/23 1521  NA 138  K 3.9  CL 106  CO2 22  GLUCOSE 112*  BUN 8  CREATININE 0.87  CALCIUM 8.8*   GFR: Estimated Creatinine Clearance: 97.3 mL/min (by C-G formula based on SCr of 0.87 mg/dL). Liver Function Tests: No results for input(s): AST, ALT, ALKPHOS, BILITOT, PROT, ALBUMIN in the last 168 hours. No results for input(s): LIPASE, AMYLASE in the last 168 hours. No results for input(s): AMMONIA in the last 168 hours. Coagulation Profile: No results for input(s): INR, PROTIME in the last 168 hours. Cardiac Enzymes: No results for input(s): CKTOTAL, CKMB, CKMBINDEX, TROPONINI in the last 168 hours. BNP (last 3 results) No results for input(s): PROBNP in the last 8760 hours. HbA1C: No results for input(s): HGBA1C in the last 72 hours. CBG: No results for input(s): GLUCAP in the last 168 hours. Lipid Profile: No results for input(s): CHOL, HDL, LDLCALC, TRIG, CHOLHDL, LDLDIRECT in the last 72 hours. Thyroid Function Tests: No results for input(s): TSH, T4TOTAL, FREET4, T3FREE, THYROIDAB in the last 72 hours. Anemia Panel: Recent Labs    08/24/23 1649  RETICCTPCT 4.6*   Urine analysis:    Component Value Date/Time   COLORURINE STRAW (A) 10/21/2017 1238   APPEARANCEUR CLEAR 10/21/2017 1238   LABSPEC 1.006 10/21/2017 1238   PHURINE 7.0 10/21/2017 1238   GLUCOSEU NEGATIVE 10/21/2017 1238   HGBUR NEGATIVE 10/21/2017 1238   BILIRUBINUR NEGATIVE 10/21/2017 1238   KETONESUR NEGATIVE 10/21/2017 1238   PROTEINUR NEGATIVE 10/21/2017  1238   UROBILINOGEN 1.0 10/17/2017 1513   NITRITE NEGATIVE 10/21/2017 1238   LEUKOCYTESUR TRACE (A) 10/21/2017 1238    Creatinine Clearance: Estimated Creatinine Clearance: 97.3 mL/min (by C-G formula based on SCr of 0.87 mg/dL).  Sepsis Labs: @LABRCNTIP (procalcitonin:4,lacticidven:4) )No results found for this or any previous visit (from the past 240 hours).   Radiological Exams on Admission: US  PELVIC COMPLETE WITH TRANSVAGINAL Result Date: 08/24/2023 CLINICAL DATA:  13843 Dysfunctional uterine bleeding 13843 EXAM: TRANSABDOMINAL AND TRANSVAGINAL ULTRASOUND OF PELVIS TECHNIQUE: Both transabdominal and transvaginal ultrasound examinations of the pelvis were performed. Transabdominal technique was performed for global imaging of the pelvis including uterus, ovaries, adnexal regions, and pelvic cul-de-sac.  It was necessary to proceed with endovaginal exam following the transabdominal exam to visualize the uterus, endometrium, and ovaries. COMPARISON:  None Available. FINDINGS: Uterus Measurements: 10.9 x 4 x 5.6 cm = volume: 127 mL. No fibroids or other mass visualized. Endometrium Thickness: 4 mm.  No focal abnormality visualized. Right ovary Measurements: 3.3 x 2.6 x 2.4 cm = volume: 10.6 mL. Normal appearance/no adnexal mass. Left ovary Measurements: 3.3 x 2.2 x 2.7 cm = volume: 10.6 mL. Normal appearance/no adnexal mass. Other findings No free pelvic fluid. IMPRESSION: Normal pelvic ultrasound. Electronically Signed   By: Rogelia Myers M.D.   On: 08/24/2023 15:23    EKG: Ordered and pending Assessment/Plan Principal Problem:   Symptomatic anemia  #1 symptomatic anemia patient admitted with syncope/presyncope with elevated with tachycardia as well as shortness of breath.  The symptoms have been totally new to her since she started bleeding heavily for the last 7 days.  In the ER his her hemoglobin was 6.0 and she is being admitted for symptomatic anemia pelvic ultrasound was no showed no  acute findings.  GYN was contacted by the ED physician they will see her in the morning.  They advised starting her on norethindrone .  She does get her Depo-Provera shots every 3 months by her PCP.  She does not have a GYN of her on per patient.  Transfuse 2 units of packed RBC recheck labs in a.m. continue iron  check iron  panel GYN consult in a.m. Hopefully discharge in a.m. EKG pending Admitted to observation telemetry Estimated body mass index is 35.96 kg/m as calculated from the following:   Height as of this encounter: 5' 3 (1.6 m).   Weight as of this encounter: 92.1 kg.   DVT prophylaxis: scd  Code Status: full  Family Communication:   Disposition Plan:   Consults called:   Admission status: obs    Jasmine KANDICE Hoots MD  08/24/2023, 5:17 PM

## 2023-08-25 DIAGNOSIS — N920 Excessive and frequent menstruation with regular cycle: Secondary | ICD-10-CM

## 2023-08-25 DIAGNOSIS — D649 Anemia, unspecified: Secondary | ICD-10-CM | POA: Diagnosis not present

## 2023-08-25 LAB — TYPE AND SCREEN
ABO/RH(D): A POS
Antibody Screen: NEGATIVE
Unit division: 0
Unit division: 0

## 2023-08-25 LAB — BPAM RBC
Blood Product Expiration Date: 202509062359
Blood Product Expiration Date: 202509072359
ISSUE DATE / TIME: 202508071535
ISSUE DATE / TIME: 202508072022
Unit Type and Rh: 6200
Unit Type and Rh: 6200

## 2023-08-25 LAB — GC/CHLAMYDIA PROBE AMP (~~LOC~~) NOT AT ARMC
Chlamydia: NEGATIVE
Comment: NEGATIVE
Comment: NORMAL
Neisseria Gonorrhea: NEGATIVE

## 2023-08-25 LAB — HEMOGLOBIN AND HEMATOCRIT, BLOOD
HCT: 25.8 % — ABNORMAL LOW (ref 36.0–46.0)
Hemoglobin: 7.9 g/dL — ABNORMAL LOW (ref 12.0–15.0)

## 2023-08-25 LAB — RPR: RPR Ser Ql: NONREACTIVE

## 2023-08-25 MED ORDER — SENNOSIDES-DOCUSATE SODIUM 8.6-50 MG PO TABS: 2.0000 | ORAL_TABLET | Freq: Every day | ORAL | Status: AC

## 2023-08-25 MED ORDER — SENNOSIDES-DOCUSATE SODIUM 8.6-50 MG PO TABS: 2.0000 | ORAL_TABLET | Freq: Every day | ORAL | Status: DC

## 2023-08-25 MED ORDER — SODIUM CHLORIDE 0.9 % IV SOLN
200.0000 mg | Freq: Once | INTRAVENOUS | Status: AC
  Administered 2023-08-25: 200 mg via INTRAVENOUS
  Filled 2023-08-25: qty 10

## 2023-08-25 MED ORDER — NORETHINDRONE ACETATE 5 MG PO TABS: 5.0000 mg | ORAL_TABLET | Freq: Every day | ORAL | 0 refills | Status: DC

## 2023-08-25 NOTE — Discharge Summary (Signed)
 Physician Discharge Summary  Jasmine Ritter FMW:[id number] DOB: December 05, 1985 DOA: 08/24/2023  PCP: [facility]  Admit date: 08/24/2023 Discharge date: 08/25/2023  Admitted From: home Disposition:home Recommendations for Outpatient Follow-up:  Follow up with PCP in 1-2 weeks Please obtain BMP/CBC in one week Please follow up with Dr Alger gyn  Home Health:no Equipment/Devices:none Discharge Condition:stable CODE STATUS:full Diet recommendation: cardiac  Brief/Interim Summary:   38 y.o. female with medical history significant of anemia, iron  major depression, admitted with complaints of near syncope/syncope x 3.  She has been having heavy bleeding for the last 7 days she has she thought it would go away on its own she had golf ball sized blood clots and with any simple activity she was getting short of breath and tachypneic and she felt her heart was racing.  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  She has never been hospitalized prior to this for the same reason.  She does complain of nausea but no vomiting.  She has diarrhea and constipation alternating she is due to have a GI workup done at [facility].  She denied fever chills chest pain.  She does complain of exertional dyspnea and palpitations.  No history of CAD.  No smoking.  No abdominal pain or urinary complaints.  In the ER she had a pelvic ultrasound which was reported as no acute abnormality, hemoglobin was 6.02 units of blood transfusion was ordered in the ED.  ED physician discussed with Dr. Garland and she was started on norethindrone , she received IV fluids in the ED.   Discharge Diagnoses:  Principal Problem:   Symptomatic anemia     #1 symptomatic anemia patient admitted with syncope/presyncope with elevated with tachycardia as well as shortness of breath.  The symptoms have been totally new to her since she started bleeding heavily for the last 7 days.  In the ER his her hemoglobin was  6.0 and she is being admitted for symptomatic anemia pelvic ultrasound was no showed no acute findings.  GYN was contacted by the ED physician  They advised starting her on norethindrone .  She does get her Depo-Provera shots every 3 months by her PCP.  She does not have a GYN   Transfused 2 units of packed RBC.hb 7.9 on dc.anemia panel shows severe iron  deficiency.she received iron  infusion. Discharged on Norethindrone .seen by dr Alise.  Estimated body mass index is 35.96 kg/m as calculated from the following:   Height as of this encounter: 5' 3 (1.6 m).   Weight as of this encounter: 92.1 kg.    Estimated body mass index is 35.96 kg/m as calculated from the following:   Height as of this encounter: 5' 3 (1.6 m).   Weight as of this encounter: 92.1 kg.  Discharge Instructions  Discharge Instructions     Diet - low sodium heart healthy   Complete by: As directed    Increase activity slowly   Complete by: As directed       Allergies as of 08/25/2023   No Known Allergies      Medication List     STOP taking these medications    medroxyPROGESTERone Acetate 150 MG/ML Susy       TAKE these medications    ferrous sulfate  324 MG Tbec Take 324 mg by mouth. Notes to patient: Take with food   norethindrone  5 MG tablet Commonly known as: AYGESTIN  Take 1 tablet (5 mg total) by mouth daily. Start taking on:  August 26, 2023   senna-docusate 8.6-50 MG tablet Commonly known as: Senokot-S Take 2 tablets by mouth at bedtime.        Follow-up Information     [facility] Follow up.   Why: An appointment will be scheduled for you to follow up in 2 weeks. Please contact the office by Wednesday if an appointment has not been scheduled for you before then Contact information: [address] [place]  [address] [phone]        [facility] Follow up.   Contact information: [address] [place]  [address] [phone]                No Known Allergies  Consultations: gyn   Procedures/Studies: US  PELVIC COMPLETE WITH TRANSVAGINAL Result Date: 08/24/2023 CLINICAL DATA:  13843 Dysfunctional uterine bleeding 13843 EXAM: TRANSABDOMINAL AND TRANSVAGINAL ULTRASOUND OF PELVIS TECHNIQUE: Both transabdominal and transvaginal ultrasound examinations of the pelvis were performed. Transabdominal technique was performed for global imaging of the pelvis including uterus, ovaries, adnexal regions, and pelvic cul-de-sac. It was necessary to proceed with endovaginal exam following the transabdominal exam to visualize the uterus, endometrium, and ovaries. COMPARISON:  None Available. FINDINGS: Uterus Measurements: 10.9 x 4 x 5.6 cm = volume: 127 mL. No fibroids or other mass visualized. Endometrium Thickness: 4 mm.  No focal abnormality visualized. Right ovary Measurements: 3.3 x 2.6 x 2.4 cm = volume: 10.6 mL. Normal appearance/no adnexal mass. Left ovary Measurements: 3.3 x 2.2 x 2.7 cm = volume: 10.6 mL. Normal appearance/no adnexal mass. Other findings No free pelvic fluid. IMPRESSION: Normal pelvic ultrasound. Electronically Signed   By: Rogelia Myers M.D.   On: 08/24/2023 15:23   (Echo, Carotid, EGD, Colonoscopy, ERCP)    Subjective: Feels better no dizzy anxious to go home   Discharge Exam: Vitals:   08/25/23 0632 08/25/23 1359  BP: (!) 106/59 115/68  Pulse: 83 91  Resp: 18   Temp: 98.1 F (36.7 C) 98.1 F (36.7 C)  SpO2: 98% 97%   Vitals:   08/24/23 2052 08/24/23 2354 08/25/23 0632 08/25/23 1359  BP: 116/63 115/63 (!) 106/59 115/68  Pulse: 99 91 83 91  Resp: 16 14 18    Temp: 98.5 F (36.9 C) 98.9 F (37.2 C) 98.1 F (36.7 C) 98.1 F (36.7 C)  TempSrc: Oral Oral Oral   SpO2: 98% 96% 98% 97%  Weight:      Height:        General: Pt is alert, awake, not in acute distress Cardiovascular: RRR, S1/S2 +, no rubs, no gallops Respiratory: CTA bilaterally, no wheezing, no  rhonchi Abdominal: Soft, NT, ND, bowel sounds + Extremities: no edema, no cyanosis    The results of significant diagnostics from this hospitalization (including imaging, microbiology, ancillary and laboratory) are listed below for reference.     Microbiology: Recent Results (from the past 240 hours)  Wet prep, genital     Status: None   Collection Time: 08/24/23  4:22 PM   Specimen: PATH Cytology Cervicovaginal Ancillary Only  Result Value Ref Range Status   Yeast Wet Prep HPF POC NONE SEEN NONE SEEN Final   Trich, Wet Prep NONE SEEN NONE SEEN Final   Clue Cells Wet Prep HPF POC NONE SEEN NONE SEEN Final   WBC, Wet Prep HPF POC <10 <10 Final   Sperm NONE SEEN  Final    Comment: Performed at [facility], [address] [place], [place]  [address]     Labs: BNP (last 3 results) No results for input(s): BNP in the last 8760 hours. Basic Metabolic Panel: Recent Labs  Lab 08/24/23 1521  NA 138  K 3.9  CL 106  CO2 22  GLUCOSE 112*  BUN 8  CREATININE 0.87  CALCIUM 8.8*   Liver Function Tests: No results for input(s): AST, ALT, ALKPHOS, BILITOT, PROT, ALBUMIN in the last 168 hours. No results for input(s): LIPASE, AMYLASE in the last 168 hours. No results for input(s): AMMONIA in the last 168 hours. CBC: Recent Labs  Lab 08/24/23 1134 08/25/23 0343  WBC 6.2  --   HGB 6.0* 7.9*  HCT 20.9* 25.8*  MCV 83.6  --   PLT 230  --    Cardiac Enzymes: No results for input(s): CKTOTAL, CKMB, CKMBINDEX, TROPONINI in the last 168 hours. BNP: Invalid input(s): POCBNP CBG: No results for input(s): GLUCAP in the last 168 hours. D-Dimer No results for input(s): DDIMER in the last 72 hours. Hgb A1c No results for input(s): HGBA1C in the last 72 hours. Lipid Profile No results for input(s): CHOL, HDL, LDLCALC, TRIG, CHOLHDL, LDLDIRECT in the last 72 hours. Thyroid function studies No results for input(s):  TSH, T4TOTAL, T3FREE, THYROIDAB in the last 72 hours.  Invalid input(s): FREET3 Anemia work up Recent Labs    08/24/23 1649  VITAMINB12 180  FOLATE 20.4  FERRITIN 8*  TIBC 358  IRON  13*  RETICCTPCT 4.6*   Urinalysis    Component Value Date/Time   COLORURINE STRAW (A) 10/21/2017 1238   APPEARANCEUR CLEAR 10/21/2017 1238   LABSPEC 1.006 10/21/2017 1238   PHURINE 7.0 10/21/2017 1238   GLUCOSEU NEGATIVE 10/21/2017 1238   HGBUR NEGATIVE 10/21/2017 1238   BILIRUBINUR NEGATIVE 10/21/2017 1238   KETONESUR NEGATIVE 10/21/2017 1238   PROTEINUR NEGATIVE 10/21/2017 1238   UROBILINOGEN 1.0 10/17/2017 1513   NITRITE NEGATIVE 10/21/2017 1238   LEUKOCYTESUR TRACE (A) 10/21/2017 1238   Sepsis Labs Recent Labs  Lab 08/24/23 1134  WBC 6.2   Microbiology Recent Results (from the past 240 hours)  Wet prep, genital     Status: None   Collection Time: 08/24/23  4:22 PM   Specimen: PATH Cytology Cervicovaginal Ancillary Only  Result Value Ref Range Status   Yeast Wet Prep HPF POC NONE SEEN NONE SEEN Final   Trich, Wet Prep NONE SEEN NONE SEEN Final   Clue Cells Wet Prep HPF POC NONE SEEN NONE SEEN Final   WBC, Wet Prep HPF POC <10 <10 Final   Sperm NONE SEEN  Final    Comment: Performed at [facility], [address] [place], [place] [address]     Time coordinating discharge: 38 min  SIGNED:   Almarie KANDICE Hoots, MD  [facility] 08/25/2023, 5:57 PM

## 2023-08-25 NOTE — Consult Note (Signed)
 Reason for Consult:menorrhagia with symptomatic anemia Referring Physician: Dr. Will, E  Jasmine Ritter is an 38 y.o. female P3 with admitted with symptomatic anemia secondary to menorrhagia. Patient reports onset of heavy vaginal bleeding seven days ago with passage of large clots. Patient reports feeling dizzy and lightheaded with episode of near syncope x 3. She is sexually active using depo-provera for contraception. Last depo-provera was received in June. Patient states this is the first time she experienced this much vaginal bleeding. Patient reports a history of a monthly period lasting 5 days with the first 3 days being the heaviest with passage of small clots. She was hopeful that the depo-provera would help eliminate her heavy cycles. Patient received blood transfusion to help correct the anemia.Patient was also started on Aygestin  to help manage the menorrhagia. Patient reports feeling well with ongoing vaginal bleeding, improved since admission. She reports the flow seems to be slowing down and is not passing as many blood clots as previously. Patient currently does not have a GYN and is looking to establish care with one. Patient has been ambulating and denies feeling dizzy or lightheaded. Patient is interested in contraception option that would eliminate her cycle.    Menstrual History: Patient's last menstrual period was 08/24/2023 (exact date).    Past Medical History:  Diagnosis Date   ADHD    Anemia     Past Surgical History:  Procedure Laterality Date   NO PAST SURGERIES      Family History  Problem Relation Age of Onset   Cancer Mother    Breast cancer Mother    Diabetes Paternal Grandfather    ADD / ADHD Father     Social History:  reports that she has never smoked. She has never used smokeless tobacco. She reports that she does not currently use alcohol. She reports that she does not use drugs.  Allergies: No Known Allergies  Medications: I have reviewed the  patient's current medications.  Review of Systems See pertinent in HPI. All other systems reviewed and non contributory Blood pressure (!) 106/59, pulse 83, temperature 98.1 F (36.7 C), temperature source Oral, resp. rate 18, height 5' 3 (1.6 m), weight 92.1 kg, last menstrual period 08/24/2023, SpO2 98%, unknown if currently breastfeeding. Physical Exam GENERAL: Well-developed, well-nourished female in no acute distress.  LUNGS: Clear to auscultation bilaterally.  HEART: Regular rate and rhythm. ABDOMEN: Soft, nontender, nondistended. No organomegaly. PELVIC: Not indicated EXTREMITIES: No cyanosis, clubbing, or edema, 2+ distal pulses.  Results for orders placed or performed during the hospital encounter of 08/24/23 (from the past 48 hours)  hCG, serum, qualitative     Status: None   Collection Time: 08/24/23 11:34 AM  Result Value Ref Range   Preg, Serum NEGATIVE NEGATIVE    Comment:        THE SENSITIVITY OF THIS METHODOLOGY IS >10 mIU/mL. Performed at [facility], [address], [place], [place] [address]   CBC     Status: Abnormal   Collection Time: 08/24/23 11:34 AM  Result Value Ref Range   WBC 6.2 4.0 - 10.5 K/uL   RBC 2.50 (L) 3.87 - 5.11 MIL/uL   Hemoglobin 6.0 (LL) 12.0 - 15.0 g/dL    Comment: REPEATED TO VERIFY This result has been called to [facility] RN by Kip Cliche on 08/24/2023 12:25:25, and has been read back.    HCT 20.9 (L) 36.0 - 46.0 %   MCV 83.6 80.0 - 100.0 fL   MCH 24.0 (  L) 26.0 - 34.0 pg   MCHC 28.7 (L) 30.0 - 36.0 g/dL   RDW 82.3 (H) 88.4 - 84.4 %   Platelets 230 150 - 400 K/uL   nRBC 0.0 0.0 - 0.2 %    Comment: Performed at [facility], [address], [place], [place] [address]  Prepare RBC (crossmatch)     Status: None   Collection Time: 08/24/23  1:03 PM  Result Value Ref Range   Order Confirmation      ORDER PROCESSED BY BLOOD BANK Performed at [facility], [address], [place], [place] [address]   Type and screen [facility]     Status: None   Collection Time: 08/24/23  1:03 PM  Result Value Ref Range   ABO/RH(D) A POS    Antibody Screen NEG    Sample Expiration 08/27/2023,2359    Unit Number [id number]    Blood Component Type RED CELLS,LR    Unit division 00    Status of Unit ISSUED,FINAL    Transfusion Status OK TO TRANSFUSE    Crossmatch Result      Compatible Performed at [facility], [address], [place], [place] [address]    Unit Number [id number]    Blood Component Type RBC LR PHER1    Unit division 00    Status of Unit ISSUED,FINAL    Transfusion Status OK TO TRANSFUSE    Crossmatch Result Compatible   RPR     Status: None   Collection Time: 08/24/23  3:21 PM  Result Value Ref Range   RPR Ser Ql NON REACTIVE NON REACTIVE    Comment: Performed at [facility], [address], [place], [place] [address]  HIV Antibody (routine testing w rflx)     Status: None   Collection Time: 08/24/23  3:21 PM  Result Value Ref Range   HIV Screen 4th Generation wRfx Non Reactive Non Reactive    Comment: Performed at [facility], [address] [place], [place] [address]  Basic metabolic panel     Status: Abnormal   Collection Time: 08/24/23  3:21 PM  Result Value Ref Range   Sodium 138 135 - 145 mmol/L   Potassium 3.9 3.5 - 5.1 mmol/L   Chloride 106 98 - 111 mmol/L   CO2 22 22 - 32 mmol/L   Glucose, Bld 112 (H) 70 - 99 mg/dL    Comment: Glucose reference range applies only to samples taken after fasting for at least 8 hours.   BUN 8 6 - 20 mg/dL   Creatinine, Ser 9.12 0.44 - 1.00 mg/dL   Calcium 8.8 (L) 8.9 - 10.3 mg/dL   GFR, Estimated >39 >39 mL/min    Comment: (NOTE) Calculated using the CKD-EPI Creatinine Equation (2021)    Anion gap 10 5 - 15    Comment: Performed at [facility], [address] [place], [place] [address]  Wet prep, genital     Status:  None   Collection Time: 08/24/23  4:22 PM   Specimen: PATH Cytology Cervicovaginal Ancillary Only  Result Value Ref Range   Yeast Wet Prep HPF POC NONE SEEN NONE SEEN   Trich, Wet Prep NONE SEEN NONE SEEN   Clue Cells Wet Prep HPF POC NONE SEEN NONE SEEN   WBC, Wet Prep HPF POC <10 <10   Sperm NONE SEEN     Comment: Performed at [facility], [address] [place], [place] [address]  Vitamin B12     Status: None   Collection Time: 08/24/23  4:49 PM  Result Value Ref Range   Vitamin B-12 180 180 - 914 pg/mL    Comment: (NOTE) This assay is not validated for testing neonatal or myeloproliferative syndrome specimens for Vitamin B12 levels. Performed at [facility], [address] [place], [place] [address]   Folate     Status: None   Collection Time: 08/24/23  4:49 PM  Result Value Ref Range   Folate 20.4 >5.9 ng/mL    Comment: Performed at [facility], [address], [place], [place] [address]  Iron  and TIBC     Status: Abnormal   Collection Time: 08/24/23  4:49 PM  Result Value Ref Range   Iron  13 (L) 28 - 170 ug/dL   TIBC 641 749 - 549 ug/dL   Saturation Ratios 4 (L) 10.4 - 31.8 %   UIBC 345 ug/dL    Comment: Performed at [facility], [address], [place], [place] [address]  Ferritin     Status: Abnormal   Collection Time: 08/24/23  4:49 PM  Result Value Ref Range   Ferritin 8 (L) 11 - 307 ng/mL    Comment: Performed at [facility], [address], [place], [place] [address]  Reticulocytes     Status: Abnormal   Collection Time: 08/24/23  4:49 PM  Result Value Ref Range   Retic Ct Pct 4.6 (H) 0.4 - 3.1 %   RBC. 2.41 (L) 3.87 - 5.11 MIL/uL   Retic Count, Absolute 110.1 19.0 - 186.0 K/uL   Immature Retic Fract 46.3 (H) 2.3 - 15.9 %    Comment: Performed at [facility], [address], [place], [place] [address]  Hemoglobin and hematocrit, blood      Status: Abnormal   Collection Time: 08/25/23  3:43 AM  Result Value Ref Range   Hemoglobin 7.9 (L) 12.0 - 15.0 g/dL    Comment: REPEATED TO VERIFY POST TRANSFUSION SPECIMEN    HCT 25.8 (L) 36.0 - 46.0 %    Comment: Performed at [facility], [address] [place], [place] [address]    US  PELVIC COMPLETE WITH TRANSVAGINAL Result Date: 08/24/2023 CLINICAL DATA:  [id number] Dysfunctional uterine bleeding 13843 EXAM: TRANSABDOMINAL AND TRANSVAGINAL ULTRASOUND OF PELVIS TECHNIQUE: Both transabdominal and transvaginal ultrasound examinations of the pelvis were performed. Transabdominal technique was performed for global imaging of the pelvis including uterus, ovaries, adnexal regions, and pelvic cul-de-sac. It was necessary to proceed with endovaginal exam following the transabdominal exam to visualize the uterus, endometrium, and ovaries. COMPARISON:  None Available. FINDINGS: Uterus Measurements: 10.9 x 4 x 5.6 cm = volume: 127 mL. No fibroids or other mass visualized. Endometrium Thickness: 4 mm.  No focal abnormality visualized. Right ovary Measurements: 3.3 x 2.6 x 2.4 cm = volume: 10.6 mL. Normal appearance/no adnexal mass. Left ovary Measurements: 3.3 x 2.2 x 2.7 cm = volume: 10.6 mL. Normal appearance/no adnexal mass. Other findings No free pelvic fluid. IMPRESSION: Normal pelvic ultrasound. Electronically Signed   By: Rogelia Myers M.D.   On: 08/24/2023 15:23    Assessment/Plan: 38 yo P3 with symptomatic anemia secondary to menorrhagia - pelvic ultrasound reviewed with the patient which is normal - Vaginal bleeding improved on Aygestin  - Recommend continuing Aygestin  for 30 days and follow up with GYN. A follow up appointment at one of our offices  will be coordinated in the next 2 weeks - Briefly discussed levonorgestrel IUD as an option to manage menorrhagia.  - Patient stable for discharge on Aygestin  5 mg daily x 30 days and iron  supplement  Thank you for this  consult  Roneka Gilpin 08/25/2023

## 2023-08-25 NOTE — Progress Notes (Signed)
 AVS reviewed w/ patient who verbalized an understanding. PIV removed as noted. Tele removed from pt- box returned to desk. Pt dressing for d/c to home. Ride enroute

## 2023-09-11 ENCOUNTER — Encounter: Payer: Self-pay | Admitting: Obstetrics and Gynecology

## 2023-09-11 ENCOUNTER — Ambulatory Visit (INDEPENDENT_AMBULATORY_CARE_PROVIDER_SITE_OTHER): Admitting: Obstetrics and Gynecology

## 2023-09-11 VITALS — BP 121/74 | HR 79 | Wt 207.0 lb

## 2023-09-11 DIAGNOSIS — Z09 Encounter for follow-up examination after completed treatment for conditions other than malignant neoplasm: Secondary | ICD-10-CM | POA: Diagnosis not present

## 2023-09-11 MED ORDER — NORETHINDRONE ACETATE 5 MG PO TABS: 5.0000 mg | ORAL_TABLET | Freq: Every day | ORAL | 3 refills | Status: AC

## 2023-09-11 NOTE — Progress Notes (Signed)
 Pt states she feels she has had prolong HA's and some diarrhea in the mornings

## 2023-09-11 NOTE — Progress Notes (Signed)
 38 yo P5 here for follow up on menorrhagia. Patient was admitted with symptomatic anemia. She was on depo-provera and experienced improvement in her vaginal bleeding with Aygestin . Patient reports feeling well from that standpoint and desires to continue with Aygestin . Patient reports some diarrhea and dull headache since starting iron  supplement. Patient is without any other complaints.   Past Medical History:  Diagnosis Date   ADHD    Anemia    Past Surgical History:  Procedure Laterality Date   NO PAST SURGERIES     Family History  Problem Relation Age of Onset   Cancer Mother    Breast cancer Mother    Diabetes Paternal Grandfather    ADD / ADHD Father    Social History   Tobacco Use   Smoking status: Never   Smokeless tobacco: Never  Vaping Use   Vaping status: Never Used  Substance Use Topics   Alcohol use: Not Currently    Comment: socially   Drug use: No   ROS See pertinent in HPI. All other systems reviewed and non contributory Blood pressure 121/74, pulse 79, weight 207 lb (93.9 kg), last menstrual period 08/24/2023, unknown if currently breastfeeding. GENERAL: Well-developed, well-nourished female in no acute distress.  NEURO: alert and oriented x 3  A/P 38 yo with recent admission for symptomatic anemia - Vaginal bleeding resolved on Aygestin  - Patient advised to follow up with PCP if diarrhea persists - RTC in 6 months for annual exam with pap smear.
